# Patient Record
Sex: Female | Born: 1955 | Race: White | Hispanic: No | Marital: Single | Smoking: Former smoker
Health system: Southern US, Community
[De-identification: ages and names within clinical notes are randomized; demographics above are authoritative.]

## PROBLEM LIST (undated history)

## (undated) DIAGNOSIS — I10 Essential (primary) hypertension: Secondary | ICD-10-CM

## (undated) DIAGNOSIS — M1711 Unilateral primary osteoarthritis, right knee: Secondary | ICD-10-CM

## (undated) DIAGNOSIS — M797 Fibromyalgia: Secondary | ICD-10-CM

## (undated) DIAGNOSIS — S90122A Contusion of left lesser toe(s) without damage to nail, initial encounter: Secondary | ICD-10-CM

## (undated) DIAGNOSIS — E039 Hypothyroidism, unspecified: Secondary | ICD-10-CM

## (undated) DIAGNOSIS — M542 Cervicalgia: Secondary | ICD-10-CM

## (undated) DIAGNOSIS — E785 Hyperlipidemia, unspecified: Secondary | ICD-10-CM

## (undated) HISTORY — PX: OTHER SURGICAL HISTORY: SHX169

## (undated) HISTORY — DX: Hyperlipidemia, unspecified: E78.5

## (undated) HISTORY — PX: LAPAROSCOPIC TOTAL HYSTERECTOMY: SUR800

## (undated) HISTORY — PX: ABDOMINAL HYSTERECTOMY: SHX81

## (undated) HISTORY — DX: Fibromyalgia: M79.7

## (undated) HISTORY — DX: Essential (primary) hypertension: I10

## (undated) HISTORY — PX: AUGMENTATION MAMMAPLASTY: SUR837

---

## 2007-05-15 ENCOUNTER — Ambulatory Visit: Payer: Self-pay | Admitting: Dermatology

## 2008-03-20 ENCOUNTER — Ambulatory Visit: Payer: Self-pay | Admitting: Unknown Physician Specialty

## 2008-03-21 ENCOUNTER — Ambulatory Visit: Payer: Self-pay | Admitting: Anesthesiology

## 2008-03-24 HISTORY — PX: CERVICAL DISC SURGERY: SHX588

## 2008-04-04 ENCOUNTER — Ambulatory Visit: Payer: Self-pay | Admitting: Unknown Physician Specialty

## 2008-04-07 ENCOUNTER — Ambulatory Visit: Payer: Self-pay | Admitting: Unknown Physician Specialty

## 2008-05-06 ENCOUNTER — Ambulatory Visit: Payer: Self-pay | Admitting: Unknown Physician Specialty

## 2008-05-07 ENCOUNTER — Ambulatory Visit: Payer: Self-pay | Admitting: Unknown Physician Specialty

## 2008-05-13 ENCOUNTER — Ambulatory Visit: Payer: Self-pay | Admitting: Unknown Physician Specialty

## 2008-12-24 ENCOUNTER — Ambulatory Visit: Payer: Self-pay | Admitting: Unknown Physician Specialty

## 2008-12-30 ENCOUNTER — Ambulatory Visit: Payer: Self-pay | Admitting: Unknown Physician Specialty

## 2009-01-03 ENCOUNTER — Emergency Department: Payer: Self-pay | Admitting: Internal Medicine

## 2010-12-01 ENCOUNTER — Ambulatory Visit: Payer: Self-pay | Admitting: Anesthesiology

## 2011-02-21 ENCOUNTER — Emergency Department: Payer: Self-pay | Admitting: *Deleted

## 2011-02-22 LAB — BASIC METABOLIC PANEL
Anion Gap: 8 (ref 7–16)
Chloride: 107 mmol/L (ref 98–107)
Co2: 30 mmol/L (ref 21–32)
Creatinine: 0.72 mg/dL (ref 0.60–1.30)
Glucose: 88 mg/dL (ref 65–99)
Osmolality: 288 (ref 275–301)

## 2011-09-29 ENCOUNTER — Ambulatory Visit: Payer: Self-pay | Admitting: Unknown Physician Specialty

## 2011-10-31 ENCOUNTER — Ambulatory Visit: Payer: Self-pay | Admitting: Unknown Physician Specialty

## 2012-05-17 ENCOUNTER — Ambulatory Visit: Payer: Self-pay | Admitting: Otolaryngology

## 2013-07-12 IMAGING — MG MM CAD SCREENING MAMMO
1 series · 7 of 7 positions shown · non-contrast
Comparison: none

REASON FOR EXAM: SCR MAMMO NO ORDER CAT 2
COMMENTS:

[R CC · right · 7 of 7 slices shown]
[im 1/7]
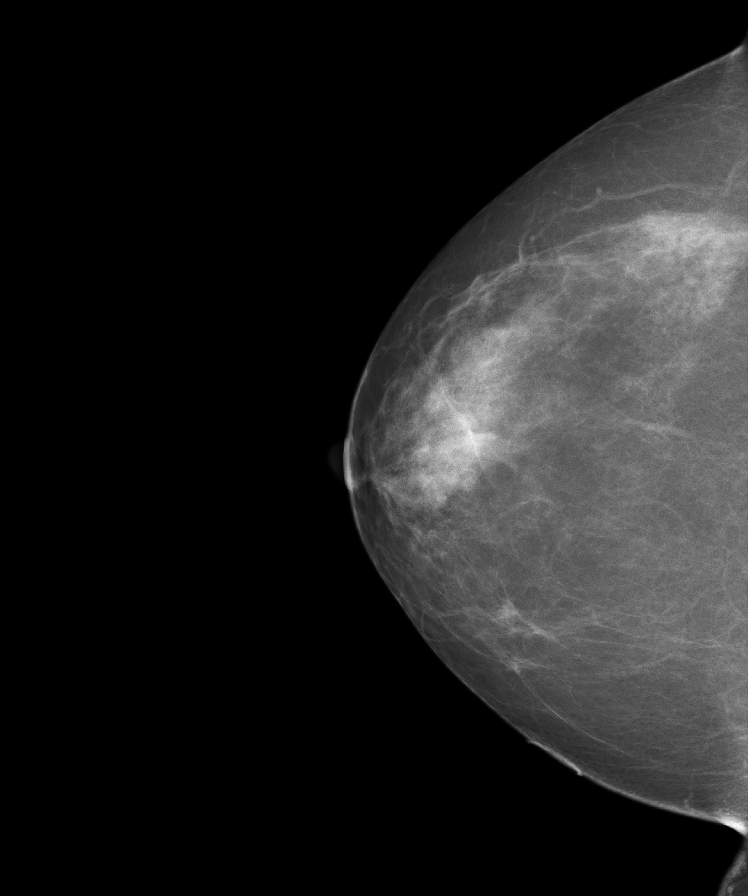
[im 2/7]
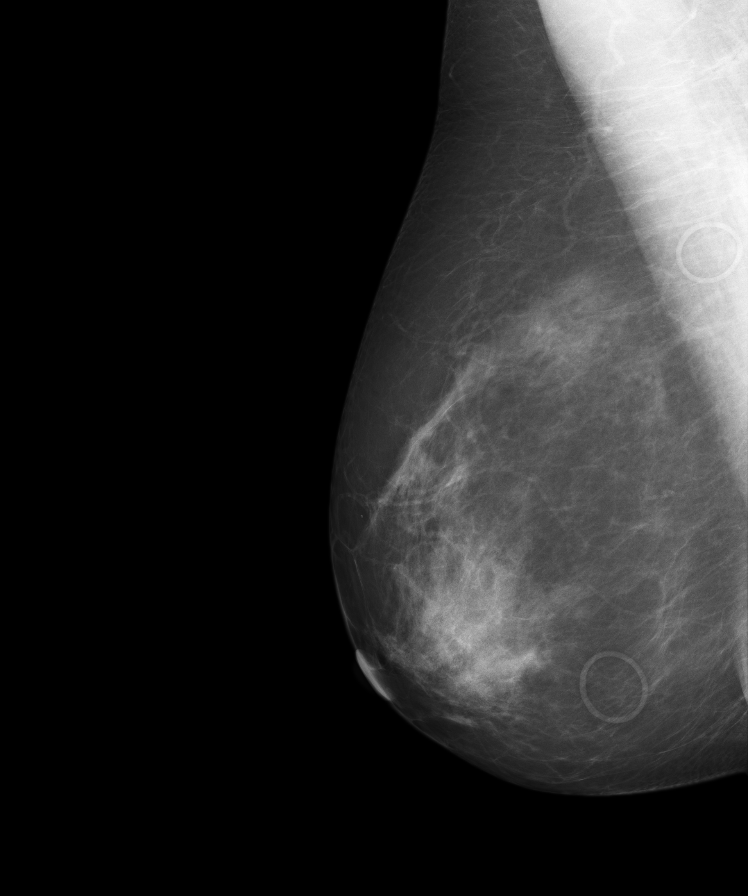
[im 3/7]
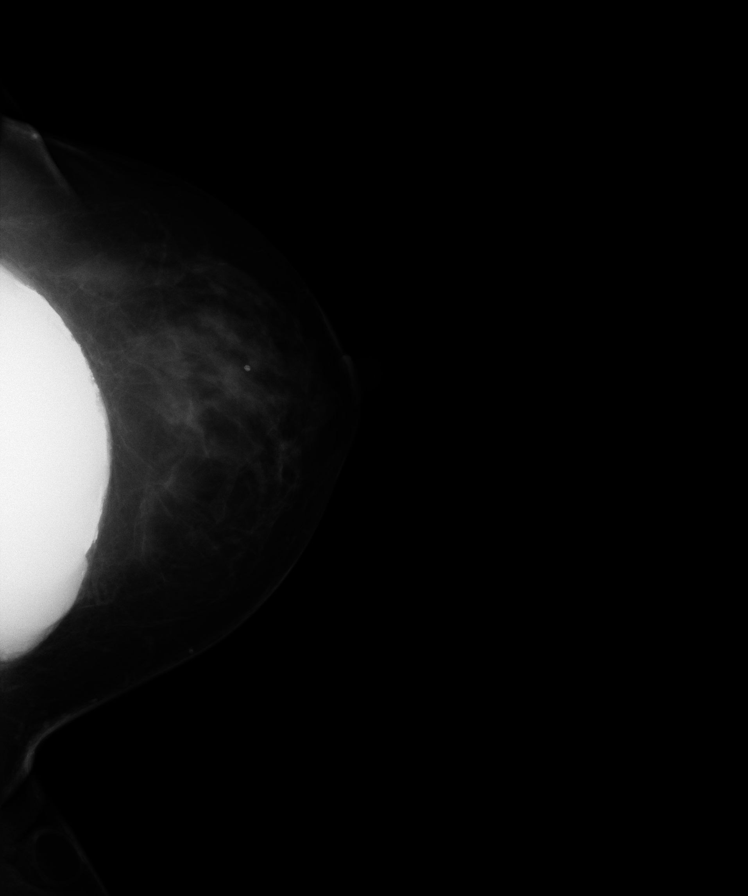
[im 4/7]
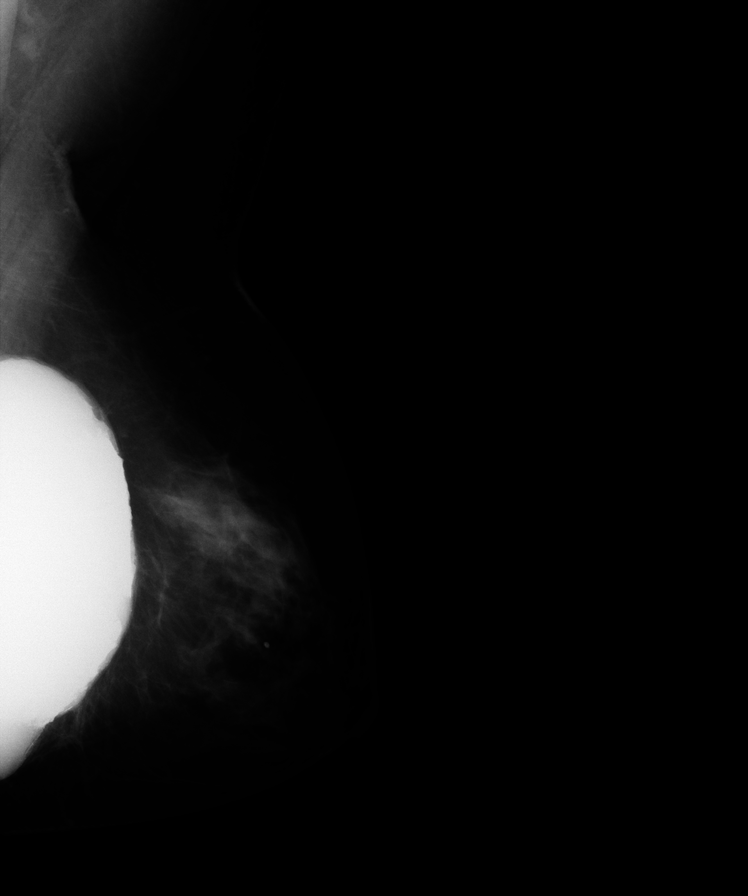
[im 5/7]
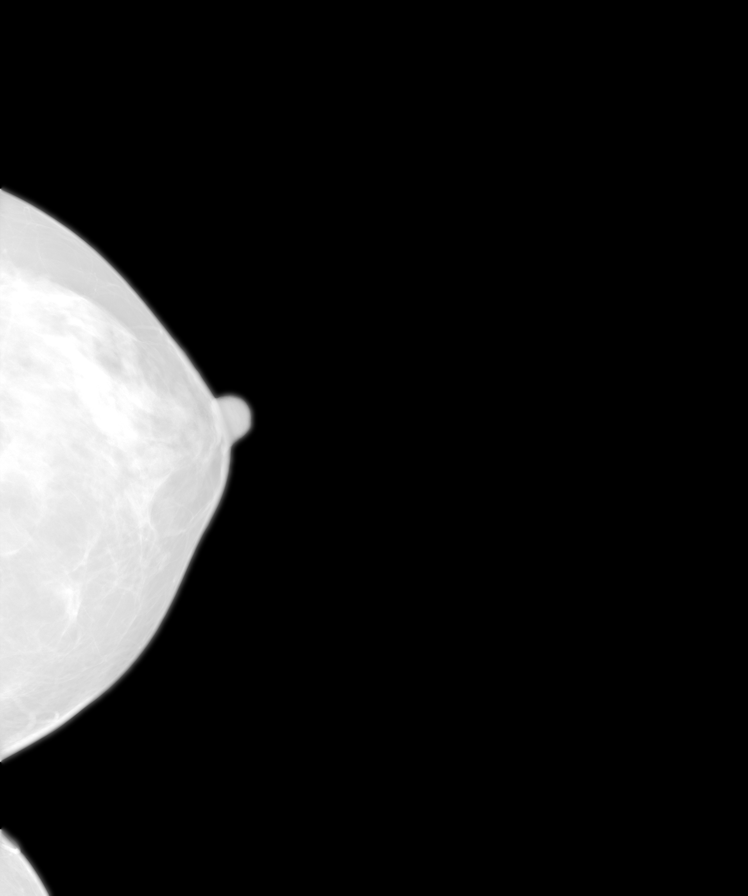
[im 6/7]
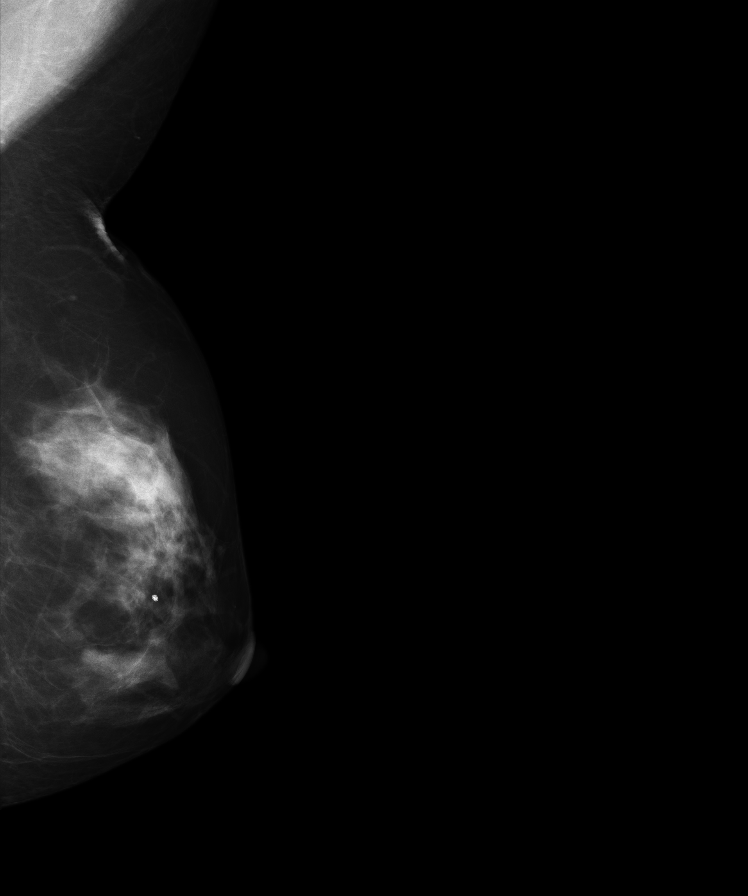
[im 7/7]
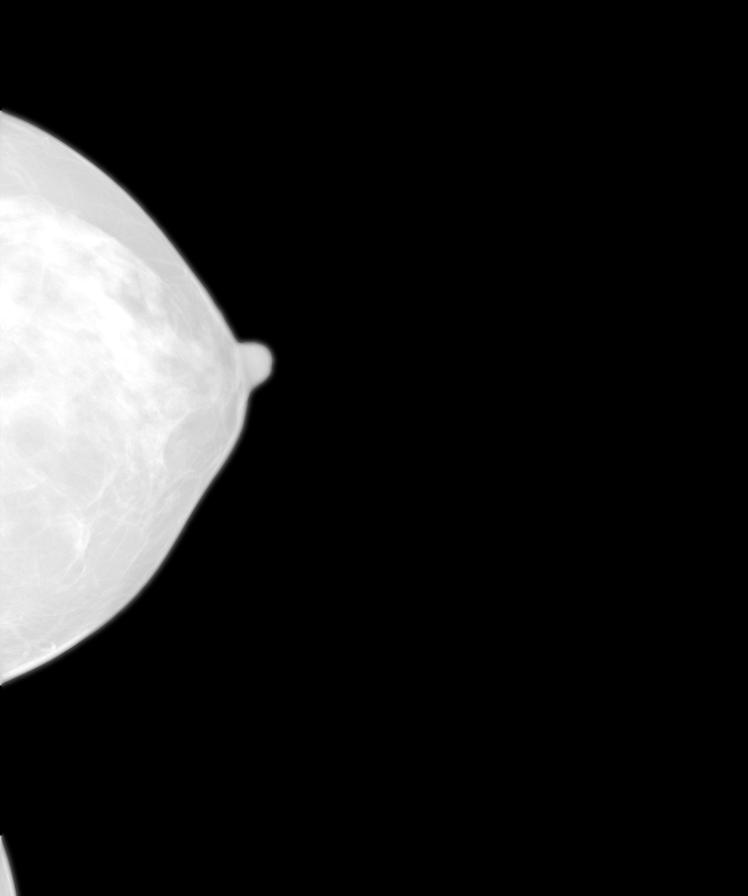

[7 of 7 positions shown; findings below may reference images not displayed]

PROCEDURE:     MAM - MAM DGTL SCRN MAM NO ORDER W/CAD  - September 29, 2011  [DATE]

RESULT:     The patient reports previous left breast implant in 6587. There
is a family history of breast cancer in the patient's maternal aunt.
Comparison is made to previous digital mammograms dated 06 May, 2008, as
well as 28 June, 2001. There is a moderately dense parenchymal pattern without
a dominant mass or malignant appearing calcification. There is no
architectural distortion. There is some lobulation along the inferior aspect
of the implant on the MLO view which is unchanged.
IMPRESSION: 1. Stable, benign appearing bilateral mammogram.

BI-RADS: Category 2 - Benign Finding.

Please continue to encourage annual mammographic follow-up.

A NEGATIVE MAMMOGRAM REPORT DOES NOT PRECLUDE BIOPSY OR OTHER EVALUATION OF
A CLINICALLY PALPABLE OR OTHERWISE SUSPICIOUS MASS OR LESION. BREAST CANCER
MAY NOT BE DETECTED BY MAMMOGRAPHY IN UP TO 10% OF CASES.

[REDACTED]

## 2014-05-06 ENCOUNTER — Encounter: Payer: Self-pay | Admitting: *Deleted

## 2014-11-27 ENCOUNTER — Other Ambulatory Visit: Payer: Self-pay | Admitting: Family

## 2014-11-27 ENCOUNTER — Ambulatory Visit
Admission: RE | Admit: 2014-11-27 | Discharge: 2014-11-27 | Disposition: A | Discharge status: Home / Self Care | Payer: PRIVATE HEALTH INSURANCE | Source: Ambulatory Visit | Attending: Family | Admitting: Family

## 2014-11-27 DIAGNOSIS — Y939 Activity, unspecified: Secondary | ICD-10-CM | POA: Diagnosis not present

## 2014-11-27 DIAGNOSIS — Y92239 Unspecified place in hospital as the place of occurrence of the external cause: Secondary | ICD-10-CM | POA: Insufficient documentation

## 2014-11-27 DIAGNOSIS — R52 Pain, unspecified: Secondary | ICD-10-CM

## 2014-11-27 DIAGNOSIS — M25474 Effusion, right foot: Secondary | ICD-10-CM | POA: Insufficient documentation

## 2014-11-27 DIAGNOSIS — Y990 Civilian activity done for income or pay: Secondary | ICD-10-CM | POA: Diagnosis not present

## 2014-11-27 DIAGNOSIS — W319XXA Contact with unspecified machinery, initial encounter: Secondary | ICD-10-CM | POA: Insufficient documentation

## 2014-11-27 DIAGNOSIS — M79671 Pain in right foot: Secondary | ICD-10-CM | POA: Insufficient documentation

## 2014-12-29 ENCOUNTER — Encounter: Payer: Self-pay | Admitting: Physician Assistant

## 2014-12-29 ENCOUNTER — Ambulatory Visit: Payer: Self-pay | Admitting: Physician Assistant

## 2014-12-29 VITALS — BP 136/80 | Temp 98.8°F

## 2014-12-29 DIAGNOSIS — N390 Urinary tract infection, site not specified: Secondary | ICD-10-CM

## 2014-12-29 DIAGNOSIS — R319 Hematuria, unspecified: Secondary | ICD-10-CM

## 2014-12-29 DIAGNOSIS — R300 Dysuria: Secondary | ICD-10-CM

## 2014-12-29 LAB — POCT URINALYSIS DIPSTICK
Bilirubin, UA: NEGATIVE
KETONES UA: NEGATIVE
Nitrite, UA: POSITIVE
PH UA: 5.5
Spec Grav, UA: 1.025
Urobilinogen, UA: 1

## 2014-12-29 MED ORDER — CIPROFLOXACIN HCL 250 MG PO TABS: 250.0000 mg | ORAL_TABLET | Freq: Two times a day (BID) | ORAL | Status: DC

## 2014-12-29 MED ORDER — FLUCONAZOLE 150 MG PO TABS: 150.0000 mg | ORAL_TABLET | Freq: Once | ORAL | Status: DC

## 2014-12-29 NOTE — Progress Notes (Signed)
S:  C/o uti sx for 2 days, burning, urgency, frequency, denies vaginal discharge, abdominal pain or flank pain:  Remainder ros neg  O:  Vitals wnl, nad, no cva tenderness, back nontender, lungs c t a,cv rrr, abd soft nontender, bs normal, n/v intact  A: uti  P: cipro 250mg bid x 7d, increase water intake, add cranberry juice, return if not improving in 2 -3 days, return earlier if worsening, discussed pyelonephritis sx  

## 2015-02-20 DIAGNOSIS — J069 Acute upper respiratory infection, unspecified: Secondary | ICD-10-CM | POA: Diagnosis not present

## 2015-06-10 ENCOUNTER — Other Ambulatory Visit: Payer: Self-pay | Admitting: Obstetrics and Gynecology

## 2015-06-10 DIAGNOSIS — Z1231 Encounter for screening mammogram for malignant neoplasm of breast: Secondary | ICD-10-CM

## 2015-07-31 ENCOUNTER — Ambulatory Visit: Payer: Self-pay | Admitting: Physician Assistant

## 2015-07-31 ENCOUNTER — Encounter: Payer: Self-pay | Admitting: Physician Assistant

## 2015-07-31 VITALS — BP 152/90 | HR 84 | Temp 98.0°F

## 2015-07-31 DIAGNOSIS — J018 Other acute sinusitis: Secondary | ICD-10-CM

## 2015-07-31 DIAGNOSIS — W57XXXA Bitten or stung by nonvenomous insect and other nonvenomous arthropods, initial encounter: Secondary | ICD-10-CM

## 2015-07-31 MED ORDER — DOXYCYCLINE HYCLATE 100 MG PO TABS: 100.0000 mg | ORAL_TABLET | Freq: Two times a day (BID) | ORAL | Status: DC

## 2015-07-31 MED ORDER — FLUTICASONE PROPIONATE 50 MCG/ACT NA SUSP: 2.0000 | Freq: Every day | NASAL | Status: DC

## 2015-07-31 NOTE — Progress Notes (Signed)
S: C/o headache and  congestion for 3 days, no fever, chills, cp/sob, v/d; mucus is green and thick, cough is sporadic, c/o of facial and dental pain. Also pulled a tick off about a week ago, states has had sinus infections before but never had a headache with it  Using otc meds:   O: PE: perrl eomi, normocephalic, tms dull, nasal mucosa red and swollen, throat injected, neck supple no lymph, lungs c t a, cv rrr, neuro intact  A:  Acute sinusitis, tick bite  P: drink fluids, continue regular meds , use otc meds of choice, return if not improving in 5 days, return earlier if worsening , doxy 100mg  , flonase

## 2015-08-04 ENCOUNTER — Encounter: Payer: Self-pay | Admitting: Physician Assistant

## 2015-08-04 ENCOUNTER — Ambulatory Visit: Payer: Self-pay | Admitting: Physician Assistant

## 2015-08-04 VITALS — BP 142/80 | HR 80 | Temp 99.0°F

## 2015-08-04 DIAGNOSIS — R112 Nausea with vomiting, unspecified: Secondary | ICD-10-CM

## 2015-08-04 DIAGNOSIS — J0110 Acute frontal sinusitis, unspecified: Secondary | ICD-10-CM

## 2015-08-04 MED ORDER — PREDNISONE 10 MG PO TABS: 30.0000 mg | ORAL_TABLET | Freq: Every day | ORAL | Status: DC

## 2015-08-04 MED ORDER — ONDANSETRON 4 MG PO TBDP: 4.0000 mg | ORAL_TABLET | Freq: Three times a day (TID) | ORAL | Status: DC | PRN

## 2015-08-04 NOTE — Progress Notes (Signed)
S: N+ V x 1 day with doxycyline.  Taking for tick bite and sinusitis.  Still has left sided facial pressure.  Sm amount of rash at tick bite but no other rash present.  Con't use of flonase, 5 day of doxycycline. O: Tender left frontal and max sinus,  abd soft non-tender, bs nl.  Minimal rash noted at tick bite site with bite healing w/o infection A: Acute sinusitis and tick bite P: Zofran 4mg  ODT # 20 and prednisone 30mg  x 3 days

## 2015-08-05 DIAGNOSIS — R51 Headache: Secondary | ICD-10-CM | POA: Diagnosis not present

## 2015-08-05 DIAGNOSIS — A938 Other specified arthropod-borne viral fevers: Secondary | ICD-10-CM | POA: Diagnosis not present

## 2015-08-05 DIAGNOSIS — R110 Nausea: Secondary | ICD-10-CM | POA: Diagnosis not present

## 2016-07-22 ENCOUNTER — Encounter: Payer: Self-pay | Admitting: Physician Assistant

## 2016-07-22 ENCOUNTER — Ambulatory Visit: Payer: Self-pay | Admitting: Physician Assistant

## 2016-07-22 VITALS — BP 150/90 | HR 96 | Temp 98.6°F

## 2016-07-22 DIAGNOSIS — J0100 Acute maxillary sinusitis, unspecified: Secondary | ICD-10-CM

## 2016-07-22 MED ORDER — PREDNISONE 10 MG PO TABS: 30.0000 mg | ORAL_TABLET | Freq: Every day | ORAL | 0 refills | Status: DC

## 2016-07-22 MED ORDER — FLUTICASONE PROPIONATE 50 MCG/ACT NA SUSP: 2.0000 | Freq: Every day | NASAL | 6 refills | Status: AC

## 2016-07-22 NOTE — Progress Notes (Addendum)
S: C/o runny nose and congestion for 2 days, no fever, chills, cp/sob, v/d; mucus is ?green and thick, c/o of facial and dental pressure, used mucinex   Using otc meds:   O: PE: vitals wnl, nad, perrl eomi, normocephalic, tms dull, nasal mucosa red and swollen, throat injected, neck supple no lymph, lungs c t a, cv rrr, neuro intact  A:  Acute sinusitis   P: drink fluids, continue regular meds , use otc meds of choice, return if not improving in 5 days, return earlier if worsening , prednisone 30mg  qd x3d, flonase, will call in antibiotic if not better by next week

## 2016-07-25 ENCOUNTER — Telehealth: Payer: Self-pay | Admitting: Family

## 2016-07-25 MED ORDER — AZITHROMYCIN 250 MG PO TABS: ORAL_TABLET | ORAL | 0 refills | Status: DC

## 2016-07-25 NOTE — Telephone Encounter (Signed)
Patient was contacted per Payton Doughty. Moore e scribed Zithromax to [facility] pharmacy.

## 2016-07-26 NOTE — Progress Notes (Signed)
Patient contacted office requesting antibiotic. Per T.Moore prescribed Zpak. Patient was informed escript over to [facility] pharmacy

## 2016-08-12 DIAGNOSIS — M797 Fibromyalgia: Secondary | ICD-10-CM | POA: Diagnosis not present

## 2016-08-12 DIAGNOSIS — E038 Other specified hypothyroidism: Secondary | ICD-10-CM | POA: Diagnosis not present

## 2016-08-19 DIAGNOSIS — E785 Hyperlipidemia, unspecified: Secondary | ICD-10-CM | POA: Diagnosis not present

## 2016-08-19 DIAGNOSIS — E039 Hypothyroidism, unspecified: Secondary | ICD-10-CM | POA: Diagnosis not present

## 2016-10-26 DIAGNOSIS — J329 Chronic sinusitis, unspecified: Secondary | ICD-10-CM | POA: Diagnosis not present

## 2016-12-23 DIAGNOSIS — H04123 Dry eye syndrome of bilateral lacrimal glands: Secondary | ICD-10-CM | POA: Diagnosis not present

## 2017-01-31 DIAGNOSIS — J069 Acute upper respiratory infection, unspecified: Secondary | ICD-10-CM | POA: Diagnosis not present

## 2017-01-31 DIAGNOSIS — J019 Acute sinusitis, unspecified: Secondary | ICD-10-CM | POA: Diagnosis not present

## 2017-05-15 DIAGNOSIS — N390 Urinary tract infection, site not specified: Secondary | ICD-10-CM | POA: Diagnosis not present

## 2017-05-16 DIAGNOSIS — E785 Hyperlipidemia, unspecified: Secondary | ICD-10-CM | POA: Diagnosis not present

## 2017-10-16 DIAGNOSIS — J019 Acute sinusitis, unspecified: Secondary | ICD-10-CM | POA: Diagnosis not present

## 2017-12-26 DIAGNOSIS — J029 Acute pharyngitis, unspecified: Secondary | ICD-10-CM | POA: Diagnosis not present

## 2018-02-07 DIAGNOSIS — R05 Cough: Secondary | ICD-10-CM | POA: Diagnosis not present

## 2018-02-07 DIAGNOSIS — J069 Acute upper respiratory infection, unspecified: Secondary | ICD-10-CM | POA: Diagnosis not present

## 2018-02-19 DIAGNOSIS — J069 Acute upper respiratory infection, unspecified: Secondary | ICD-10-CM | POA: Diagnosis not present

## 2018-02-19 DIAGNOSIS — R05 Cough: Secondary | ICD-10-CM | POA: Diagnosis not present

## 2018-03-07 DIAGNOSIS — J111 Influenza due to unidentified influenza virus with other respiratory manifestations: Secondary | ICD-10-CM | POA: Diagnosis not present

## 2018-04-02 DIAGNOSIS — R531 Weakness: Secondary | ICD-10-CM | POA: Diagnosis not present

## 2018-04-02 DIAGNOSIS — M797 Fibromyalgia: Secondary | ICD-10-CM | POA: Diagnosis not present

## 2018-04-02 DIAGNOSIS — M2550 Pain in unspecified joint: Secondary | ICD-10-CM | POA: Diagnosis not present

## 2018-04-04 DIAGNOSIS — R5383 Other fatigue: Secondary | ICD-10-CM | POA: Diagnosis not present

## 2018-04-04 DIAGNOSIS — M797 Fibromyalgia: Secondary | ICD-10-CM | POA: Diagnosis not present

## 2018-05-21 DIAGNOSIS — M797 Fibromyalgia: Secondary | ICD-10-CM | POA: Diagnosis not present

## 2018-07-10 DIAGNOSIS — M797 Fibromyalgia: Secondary | ICD-10-CM | POA: Diagnosis not present

## 2018-08-21 DIAGNOSIS — M797 Fibromyalgia: Secondary | ICD-10-CM | POA: Diagnosis not present

## 2018-08-21 DIAGNOSIS — F330 Major depressive disorder, recurrent, mild: Secondary | ICD-10-CM | POA: Diagnosis not present

## 2018-10-10 DIAGNOSIS — M797 Fibromyalgia: Secondary | ICD-10-CM | POA: Diagnosis not present

## 2018-10-10 DIAGNOSIS — Z23 Encounter for immunization: Secondary | ICD-10-CM | POA: Diagnosis not present

## 2018-12-05 DIAGNOSIS — M797 Fibromyalgia: Secondary | ICD-10-CM | POA: Diagnosis not present

## 2019-02-05 DIAGNOSIS — F330 Major depressive disorder, recurrent, mild: Secondary | ICD-10-CM | POA: Diagnosis not present

## 2019-02-05 DIAGNOSIS — M797 Fibromyalgia: Secondary | ICD-10-CM | POA: Diagnosis not present

## 2019-03-19 DIAGNOSIS — R05 Cough: Secondary | ICD-10-CM | POA: Diagnosis not present

## 2019-03-19 DIAGNOSIS — M797 Fibromyalgia: Secondary | ICD-10-CM | POA: Diagnosis not present

## 2019-05-01 ENCOUNTER — Other Ambulatory Visit: Payer: Self-pay | Admitting: Internal Medicine

## 2019-05-29 DIAGNOSIS — F330 Major depressive disorder, recurrent, mild: Secondary | ICD-10-CM | POA: Diagnosis not present

## 2019-05-29 DIAGNOSIS — E038 Other specified hypothyroidism: Secondary | ICD-10-CM | POA: Diagnosis not present

## 2019-05-29 DIAGNOSIS — M797 Fibromyalgia: Secondary | ICD-10-CM | POA: Diagnosis not present

## 2019-08-01 ENCOUNTER — Other Ambulatory Visit: Payer: Self-pay | Admitting: Internal Medicine

## 2019-08-01 DIAGNOSIS — Z0000 Encounter for general adult medical examination without abnormal findings: Secondary | ICD-10-CM | POA: Diagnosis not present

## 2019-08-01 DIAGNOSIS — Z1231 Encounter for screening mammogram for malignant neoplasm of breast: Secondary | ICD-10-CM

## 2019-08-07 ENCOUNTER — Other Ambulatory Visit: Payer: Self-pay | Admitting: Internal Medicine

## 2019-09-06 ENCOUNTER — Other Ambulatory Visit: Payer: Self-pay | Admitting: Internal Medicine

## 2019-09-06 DIAGNOSIS — E038 Other specified hypothyroidism: Secondary | ICD-10-CM | POA: Diagnosis not present

## 2019-09-06 DIAGNOSIS — M797 Fibromyalgia: Secondary | ICD-10-CM | POA: Diagnosis not present

## 2019-09-06 DIAGNOSIS — J019 Acute sinusitis, unspecified: Secondary | ICD-10-CM | POA: Diagnosis not present

## 2019-10-07 ENCOUNTER — Other Ambulatory Visit: Payer: Self-pay

## 2019-10-07 ENCOUNTER — Ambulatory Visit
Admission: RE | Admit: 2019-10-07 | Discharge: 2019-10-07 | Disposition: A | Discharge status: Home / Self Care | Payer: 59 | Source: Ambulatory Visit | Attending: Internal Medicine | Admitting: Internal Medicine

## 2019-10-07 ENCOUNTER — Other Ambulatory Visit: Payer: Self-pay | Admitting: Internal Medicine

## 2019-10-07 DIAGNOSIS — Z1231 Encounter for screening mammogram for malignant neoplasm of breast: Secondary | ICD-10-CM

## 2019-10-23 DIAGNOSIS — N390 Urinary tract infection, site not specified: Secondary | ICD-10-CM | POA: Diagnosis not present

## 2019-11-21 ENCOUNTER — Other Ambulatory Visit: Payer: Self-pay | Admitting: Internal Medicine

## 2020-01-14 ENCOUNTER — Other Ambulatory Visit: Payer: Self-pay | Admitting: Internal Medicine

## 2020-03-02 ENCOUNTER — Other Ambulatory Visit: Payer: Self-pay | Admitting: Internal Medicine

## 2020-03-03 ENCOUNTER — Other Ambulatory Visit: Payer: Self-pay | Admitting: Internal Medicine

## 2020-04-03 ENCOUNTER — Other Ambulatory Visit: Payer: Self-pay | Admitting: Internal Medicine

## 2020-05-04 MED FILL — Tramadol HCl Tab 50 MG: ORAL | 25 days supply | Qty: 100 | Fill #0 | Status: AC

## 2020-05-04 MED FILL — Duloxetine HCl Enteric Coated Pellets Cap 30 MG (Base Eq): ORAL | 60 days supply | Qty: 180 | Fill #0 | Status: AC

## 2020-05-04 MED FILL — Metoprolol Succinate Tab ER 24HR 25 MG (Tartrate Equiv): ORAL | 30 days supply | Qty: 30 | Fill #0 | Status: AC

## 2020-05-05 ENCOUNTER — Other Ambulatory Visit: Payer: Self-pay

## 2020-06-02 ENCOUNTER — Other Ambulatory Visit: Payer: Self-pay

## 2020-06-02 MED ORDER — METOPROLOL SUCCINATE ER 50 MG PO TB24
ORAL_TABLET | ORAL | 3 refills | Status: DC
  Filled 2020-06-02: qty 30, 30d supply, fill #0
  Filled 2020-06-22 – 2020-06-26 (×2): qty 30, 30d supply, fill #1
  Filled 2020-08-02: qty 30, 30d supply, fill #2
  Filled 2020-09-03: qty 30, 30d supply, fill #3
  Filled 2020-10-06: qty 30, 30d supply, fill #4
  Filled 2020-11-08: qty 30, 30d supply, fill #5
  Filled 2020-12-08: qty 30, 30d supply, fill #6
  Filled 2021-01-07: qty 30, 30d supply, fill #7
  Filled 2021-02-08: qty 30, 30d supply, fill #8
  Filled 2021-03-12: qty 30, 30d supply, fill #9
  Filled 2021-05-10: qty 30, 30d supply, fill #10

## 2020-06-02 MED FILL — Zolpidem Tartrate Tab 10 MG: ORAL | 30 days supply | Qty: 30 | Fill #0 | Status: AC

## 2020-06-02 MED FILL — Rosuvastatin Calcium Tab 10 MG: ORAL | 30 days supply | Qty: 30 | Fill #0 | Status: AC

## 2020-06-22 ENCOUNTER — Other Ambulatory Visit: Payer: Self-pay

## 2020-06-22 MED FILL — Tramadol HCl Tab 50 MG: ORAL | 25 days supply | Qty: 100 | Fill #1 | Status: AC

## 2020-06-22 MED FILL — Zolpidem Tartrate Tab 10 MG: ORAL | 30 days supply | Qty: 30 | Fill #1 | Status: CN

## 2020-06-23 ENCOUNTER — Other Ambulatory Visit: Payer: Self-pay

## 2020-06-23 MED ORDER — DULOXETINE HCL 30 MG PO CPEP
90.0000 mg | ORAL_CAPSULE | Freq: Every day | ORAL | 3 refills | Status: DC
  Filled 2020-06-23: qty 90, 30d supply, fill #0
  Filled 2020-08-02: qty 90, 30d supply, fill #1
  Filled 2020-09-03: qty 90, 30d supply, fill #2
  Filled 2020-10-06: qty 90, 30d supply, fill #3
  Filled 2020-11-08: qty 90, 30d supply, fill #4
  Filled 2020-12-08: qty 90, 30d supply, fill #5
  Filled 2021-01-07: qty 90, 30d supply, fill #6
  Filled 2021-02-08: qty 90, 30d supply, fill #7
  Filled 2021-03-12: qty 90, 30d supply, fill #8
  Filled 2021-04-08: qty 90, 30d supply, fill #9
  Filled 2021-05-10: qty 90, 30d supply, fill #10
  Filled 2021-06-02: qty 90, 30d supply, fill #11

## 2020-06-23 MED ORDER — AMITRIPTYLINE HCL 10 MG PO TABS
10.0000 mg | ORAL_TABLET | Freq: Every day | ORAL | 3 refills | Status: DC
  Filled 2020-06-23: qty 60, 30d supply, fill #0
  Filled 2020-08-02: qty 60, 30d supply, fill #1
  Filled 2020-09-02: qty 60, 30d supply, fill #2
  Filled 2020-10-06: qty 60, 30d supply, fill #3
  Filled 2020-11-08: qty 60, 30d supply, fill #4
  Filled 2020-12-08: qty 60, 30d supply, fill #5
  Filled 2021-01-07: qty 60, 30d supply, fill #6
  Filled 2021-02-08: qty 60, 30d supply, fill #7
  Filled 2021-03-12: qty 60, 30d supply, fill #8
  Filled 2021-05-10: qty 60, 30d supply, fill #9
  Filled 2021-06-15: qty 60, 30d supply, fill #10

## 2020-06-24 ENCOUNTER — Other Ambulatory Visit: Payer: Self-pay

## 2020-06-24 MED FILL — Zolpidem Tartrate Tab 10 MG: ORAL | 30 days supply | Qty: 30 | Fill #1 | Status: CN

## 2020-06-26 ENCOUNTER — Other Ambulatory Visit: Payer: Self-pay

## 2020-06-26 MED FILL — Zolpidem Tartrate Tab 10 MG: ORAL | 30 days supply | Qty: 30 | Fill #1 | Status: AC

## 2020-08-02 ENCOUNTER — Other Ambulatory Visit: Payer: Self-pay

## 2020-08-02 MED FILL — Rosuvastatin Calcium Tab 10 MG: ORAL | 30 days supply | Qty: 30 | Fill #1 | Status: AC

## 2020-08-02 MED FILL — Amlodipine Besylate Tab 5 MG (Base Equivalent): ORAL | 30 days supply | Qty: 30 | Fill #0 | Status: AC

## 2020-08-03 ENCOUNTER — Other Ambulatory Visit: Payer: Self-pay

## 2020-08-04 ENCOUNTER — Other Ambulatory Visit: Payer: Self-pay

## 2020-08-04 MED ORDER — TRAMADOL HCL 50 MG PO TABS
ORAL_TABLET | ORAL | 4 refills | Status: DC
  Filled 2020-08-04: qty 100, 25d supply, fill #0
  Filled 2020-10-06: qty 28, 7d supply, fill #1
  Filled 2020-11-08: qty 28, 7d supply, fill #2
  Filled 2020-12-08: qty 100, 25d supply, fill #3
  Filled 2021-01-07: qty 100, 25d supply, fill #4

## 2020-08-06 ENCOUNTER — Other Ambulatory Visit: Payer: Self-pay

## 2020-08-07 ENCOUNTER — Other Ambulatory Visit: Payer: Self-pay

## 2020-08-07 MED ORDER — ZOLPIDEM TARTRATE 10 MG PO TABS
ORAL_TABLET | ORAL | 1 refills | Status: DC
  Filled 2020-08-07: qty 30, 30d supply, fill #0
  Filled 2020-09-03: qty 30, 30d supply, fill #1
  Filled 2020-10-06: qty 30, 30d supply, fill #2
  Filled 2020-11-08: qty 30, 30d supply, fill #3
  Filled 2020-12-08: qty 30, 30d supply, fill #4
  Filled 2021-01-07: qty 30, 30d supply, fill #5

## 2020-08-10 ENCOUNTER — Other Ambulatory Visit: Payer: Self-pay

## 2020-08-10 MED ORDER — LEVOTHYROXINE SODIUM 75 MCG PO TABS
ORAL_TABLET | Freq: Every day | ORAL | 3 refills | Status: DC
  Filled 2020-08-10 – 2020-09-02 (×2): qty 30, 30d supply, fill #0
  Filled 2020-11-08: qty 30, 30d supply, fill #1
  Filled 2020-12-08: qty 30, 30d supply, fill #2
  Filled 2021-01-07: qty 30, 30d supply, fill #3
  Filled 2021-02-08: qty 30, 30d supply, fill #4
  Filled 2021-03-12: qty 30, 30d supply, fill #5
  Filled 2021-04-08: qty 30, 30d supply, fill #6
  Filled 2021-05-10: qty 30, 30d supply, fill #7
  Filled 2021-06-15: qty 30, 30d supply, fill #8
  Filled 2021-08-08: qty 30, 30d supply, fill #9

## 2020-08-31 ENCOUNTER — Other Ambulatory Visit: Payer: Self-pay

## 2020-09-02 ENCOUNTER — Other Ambulatory Visit: Payer: Self-pay

## 2020-09-03 ENCOUNTER — Other Ambulatory Visit: Payer: Self-pay

## 2020-09-03 MED ORDER — ROSUVASTATIN CALCIUM 10 MG PO TABS
ORAL_TABLET | ORAL | 3 refills | Status: DC
  Filled 2020-09-03: qty 30, 30d supply, fill #0
  Filled 2020-09-03: qty 90, 90d supply, fill #0
  Filled 2020-10-06: qty 30, 30d supply, fill #1
  Filled 2020-11-08: qty 30, 30d supply, fill #2
  Filled 2020-12-08: qty 30, 30d supply, fill #3
  Filled 2021-01-07: qty 30, 30d supply, fill #4
  Filled 2021-02-08: qty 30, 30d supply, fill #5
  Filled 2021-03-12: qty 30, 30d supply, fill #6
  Filled 2021-05-10: qty 30, 30d supply, fill #7
  Filled 2021-06-15: qty 30, 30d supply, fill #8
  Filled 2021-08-08: qty 30, 30d supply, fill #9

## 2020-09-03 MED FILL — Esterified Estrogens & Methyltestosterone Tab 0.625-1.25 MG: ORAL | 90 days supply | Qty: 90 | Fill #0 | Status: CN

## 2020-09-03 MED FILL — Esterified Estrogens & Methyltestosterone Tab 0.625-1.25 MG: ORAL | 30 days supply | Qty: 30 | Fill #0 | Status: CN

## 2020-10-06 MED FILL — Amlodipine Besylate Tab 5 MG (Base Equivalent): ORAL | 30 days supply | Qty: 30 | Fill #1 | Status: AC

## 2020-10-07 ENCOUNTER — Other Ambulatory Visit: Payer: Self-pay

## 2020-10-09 ENCOUNTER — Other Ambulatory Visit: Payer: Self-pay

## 2020-11-09 ENCOUNTER — Other Ambulatory Visit: Payer: Self-pay

## 2020-12-08 ENCOUNTER — Other Ambulatory Visit: Payer: Self-pay

## 2020-12-08 MED FILL — Amlodipine Besylate Tab 5 MG (Base Equivalent): ORAL | 30 days supply | Qty: 30 | Fill #2 | Status: AC

## 2021-01-07 ENCOUNTER — Other Ambulatory Visit: Payer: Self-pay

## 2021-01-11 ENCOUNTER — Other Ambulatory Visit: Payer: Self-pay

## 2021-01-11 MED ORDER — AMLODIPINE BESYLATE 5 MG PO TABS
ORAL_TABLET | ORAL | 3 refills | Status: DC
  Filled 2021-01-11 – 2021-02-08 (×2): qty 90, 90d supply, fill #0

## 2021-01-25 ENCOUNTER — Other Ambulatory Visit: Payer: Self-pay

## 2021-01-26 ENCOUNTER — Other Ambulatory Visit: Payer: Self-pay

## 2021-01-26 MED ORDER — HYDROCOD POLST-CPM POLST ER 10-8 MG/5ML PO SUER
ORAL | 0 refills | Status: DC
  Filled 2021-01-26: qty 90, 9d supply, fill #0

## 2021-01-27 ENCOUNTER — Ambulatory Visit
Admission: RE | Admit: 2021-01-27 | Discharge: 2021-01-27 | Disposition: A | Discharge status: Home / Self Care | Payer: Medicare HMO | Source: Ambulatory Visit | Attending: Internal Medicine | Admitting: Internal Medicine

## 2021-01-27 ENCOUNTER — Other Ambulatory Visit: Payer: Self-pay

## 2021-01-27 ENCOUNTER — Other Ambulatory Visit: Payer: Self-pay | Admitting: Internal Medicine

## 2021-01-27 DIAGNOSIS — R059 Cough, unspecified: Secondary | ICD-10-CM | POA: Insufficient documentation

## 2021-02-08 ENCOUNTER — Other Ambulatory Visit: Payer: Self-pay

## 2021-02-08 MED FILL — Clonidine HCl Tab 0.1 MG: ORAL | 30 days supply | Qty: 30 | Fill #0 | Status: AC

## 2021-02-09 ENCOUNTER — Other Ambulatory Visit: Payer: Self-pay

## 2021-02-09 MED ORDER — TRAMADOL HCL 50 MG PO TABS
ORAL_TABLET | ORAL | 4 refills | Status: DC
  Filled 2021-02-09: qty 100, 25d supply, fill #0
  Filled 2021-03-12: qty 100, 25d supply, fill #1
  Filled 2021-04-08: qty 100, 25d supply, fill #2
  Filled 2021-05-10: qty 100, 25d supply, fill #3
  Filled 2021-06-15: qty 100, 25d supply, fill #4

## 2021-02-09 MED ORDER — ZOLPIDEM TARTRATE 10 MG PO TABS
ORAL_TABLET | ORAL | 2 refills | Status: DC
  Filled 2021-02-09: qty 90, 90d supply, fill #0
  Filled 2021-05-10: qty 90, 90d supply, fill #1
  Filled 2021-08-08: qty 90, 90d supply, fill #2

## 2021-02-10 ENCOUNTER — Other Ambulatory Visit: Payer: Self-pay

## 2021-02-10 MED ORDER — CEFDINIR 300 MG PO CAPS
ORAL_CAPSULE | ORAL | 0 refills | Status: DC
  Filled 2021-02-10: qty 20, 10d supply, fill #0

## 2021-02-10 MED ORDER — CEFUROXIME AXETIL 250 MG PO TABS
ORAL_TABLET | ORAL | 0 refills | Status: DC
  Filled 2021-02-10: qty 10, 5d supply, fill #0

## 2021-03-12 ENCOUNTER — Other Ambulatory Visit: Payer: Self-pay

## 2021-04-09 ENCOUNTER — Other Ambulatory Visit: Payer: Self-pay

## 2021-05-10 ENCOUNTER — Other Ambulatory Visit: Payer: Self-pay

## 2021-06-02 ENCOUNTER — Other Ambulatory Visit: Payer: Self-pay

## 2021-06-03 ENCOUNTER — Other Ambulatory Visit: Payer: Self-pay

## 2021-06-04 ENCOUNTER — Other Ambulatory Visit: Payer: Self-pay

## 2021-06-07 ENCOUNTER — Other Ambulatory Visit: Payer: Self-pay

## 2021-06-07 MED ORDER — METOPROLOL SUCCINATE ER 50 MG PO TB24
ORAL_TABLET | ORAL | 3 refills | Status: DC
Start: 2021-06-07 — End: 2022-07-15
  Filled 2021-06-07: qty 90, 90d supply, fill #0
  Filled 2021-09-21: qty 90, 90d supply, fill #1
  Filled 2022-01-12: qty 90, 90d supply, fill #2
  Filled 2022-04-17: qty 90, 90d supply, fill #3

## 2021-06-15 ENCOUNTER — Other Ambulatory Visit: Payer: Self-pay

## 2021-06-16 ENCOUNTER — Other Ambulatory Visit: Payer: Self-pay

## 2021-06-16 MED ORDER — DULOXETINE HCL 30 MG PO CPEP
90.0000 mg | ORAL_CAPSULE | Freq: Every day | ORAL | 3 refills | Status: DC
  Filled 2021-07-01: qty 270, 90d supply, fill #0
  Filled 2021-09-21: qty 270, 90d supply, fill #1
  Filled 2022-01-12: qty 270, 90d supply, fill #2

## 2021-06-23 ENCOUNTER — Other Ambulatory Visit: Payer: Self-pay

## 2021-07-01 ENCOUNTER — Other Ambulatory Visit: Payer: Self-pay

## 2021-07-02 ENCOUNTER — Other Ambulatory Visit: Payer: Self-pay

## 2021-07-02 ENCOUNTER — Encounter: Payer: Self-pay | Admitting: Pharmacist

## 2021-07-02 MED ORDER — AMITRIPTYLINE HCL 10 MG PO TABS
10.0000 mg | ORAL_TABLET | Freq: Every day | ORAL | 3 refills | Status: DC
  Filled 2021-07-02: qty 180, 90d supply, fill #0
  Filled 2021-08-08: qty 60, 30d supply, fill #0
  Filled 2021-09-02: qty 60, 30d supply, fill #1
  Filled 2021-09-21: qty 59, 29d supply, fill #1
  Filled 2021-09-22: qty 1, 1d supply, fill #1
  Filled 2021-10-17: qty 60, 30d supply, fill #2
  Filled 2021-11-07 – 2022-01-12 (×2): qty 60, 30d supply, fill #3
  Filled 2022-02-27: qty 60, 30d supply, fill #4
  Filled 2022-04-17: qty 60, 30d supply, fill #5
  Filled 2022-05-20: qty 60, 30d supply, fill #6

## 2021-08-08 ENCOUNTER — Other Ambulatory Visit: Payer: Self-pay

## 2021-08-09 ENCOUNTER — Other Ambulatory Visit: Payer: Self-pay

## 2021-08-10 ENCOUNTER — Other Ambulatory Visit: Payer: Self-pay

## 2021-08-11 ENCOUNTER — Other Ambulatory Visit: Payer: Self-pay

## 2021-08-11 MED ORDER — ZOLPIDEM TARTRATE 10 MG PO TABS
ORAL_TABLET | ORAL | 3 refills | Status: DC
  Filled 2021-08-11: qty 90, 90d supply, fill #0
  Filled 2021-11-07: qty 90, 90d supply, fill #1
  Filled 2022-01-12 – 2022-02-02 (×3): qty 90, 90d supply, fill #2

## 2021-08-11 MED ORDER — TRAMADOL HCL 50 MG PO TABS
ORAL_TABLET | ORAL | 3 refills | Status: DC
  Filled 2021-08-11: qty 100, 25d supply, fill #0
  Filled 2021-09-21: qty 100, 25d supply, fill #1
  Filled 2022-01-12: qty 100, 25d supply, fill #2

## 2021-09-02 ENCOUNTER — Other Ambulatory Visit: Payer: Self-pay

## 2021-09-03 ENCOUNTER — Other Ambulatory Visit: Payer: Self-pay

## 2021-09-03 MED ORDER — LEVOTHYROXINE SODIUM 75 MCG PO TABS
ORAL_TABLET | Freq: Every day | ORAL | 3 refills | Status: DC
  Filled 2021-09-03 – 2021-09-21 (×2): qty 90, 90d supply, fill #0
  Filled 2022-01-12: qty 90, 90d supply, fill #1
  Filled 2022-04-17: qty 90, 90d supply, fill #2
  Filled 2022-07-12: qty 90, 90d supply, fill #3

## 2021-09-16 ENCOUNTER — Other Ambulatory Visit: Payer: Self-pay

## 2021-09-21 ENCOUNTER — Other Ambulatory Visit: Payer: Self-pay

## 2021-09-22 ENCOUNTER — Other Ambulatory Visit: Payer: Self-pay

## 2021-09-22 MED ORDER — ROSUVASTATIN CALCIUM 10 MG PO TABS
ORAL_TABLET | ORAL | 3 refills | Status: DC
  Filled 2021-09-22: qty 90, 90d supply, fill #0
  Filled 2022-01-12: qty 90, 90d supply, fill #1
  Filled 2022-04-17: qty 90, 90d supply, fill #2
  Filled 2022-07-12: qty 90, 90d supply, fill #3

## 2021-10-18 ENCOUNTER — Other Ambulatory Visit: Payer: Self-pay

## 2021-10-19 ENCOUNTER — Other Ambulatory Visit: Payer: Self-pay

## 2021-10-19 MED ORDER — LOSARTAN POTASSIUM 50 MG PO TABS
50.0000 mg | ORAL_TABLET | Freq: Every day | ORAL | 3 refills | Status: DC
  Filled 2021-10-19: qty 90, 90d supply, fill #0
  Filled 2022-01-12: qty 90, 90d supply, fill #1
  Filled 2022-04-17: qty 90, 90d supply, fill #2
  Filled 2022-07-12: qty 90, 90d supply, fill #3

## 2021-11-07 ENCOUNTER — Other Ambulatory Visit: Payer: Self-pay

## 2021-11-08 ENCOUNTER — Other Ambulatory Visit: Payer: Self-pay

## 2021-11-09 ENCOUNTER — Other Ambulatory Visit: Payer: Self-pay

## 2021-11-23 ENCOUNTER — Other Ambulatory Visit: Payer: Self-pay

## 2022-01-13 ENCOUNTER — Other Ambulatory Visit: Payer: Self-pay

## 2022-02-02 ENCOUNTER — Other Ambulatory Visit: Payer: Self-pay

## 2022-02-14 ENCOUNTER — Other Ambulatory Visit: Payer: Self-pay

## 2022-02-14 MED ORDER — NAPROXEN 250 MG PO TABS
ORAL_TABLET | ORAL | 0 refills | Status: DC
Start: 2022-02-14 — End: 2023-08-03
  Filled 2022-02-14: qty 16, 5d supply, fill #0

## 2022-02-14 MED ORDER — AZITHROMYCIN 250 MG PO TABS
ORAL_TABLET | ORAL | 0 refills | Status: DC
Start: 2022-02-14 — End: 2023-08-03
  Filled 2022-02-14: qty 6, 5d supply, fill #0

## 2022-02-14 MED ORDER — CHLORHEXIDINE GLUCONATE 0.12 % MT SOLN
15.0000 mL | Freq: Two times a day (BID) | OROMUCOSAL | 12 refills | Status: DC
  Filled 2022-02-14: qty 473, 16d supply, fill #0
  Filled 2022-03-01: qty 473, 16d supply, fill #1

## 2022-02-15 ENCOUNTER — Other Ambulatory Visit: Payer: Self-pay

## 2022-02-27 ENCOUNTER — Other Ambulatory Visit: Payer: Self-pay

## 2022-02-28 ENCOUNTER — Other Ambulatory Visit: Payer: Self-pay

## 2022-02-28 MED ORDER — TRAMADOL HCL 50 MG PO TABS
50.0000 mg | ORAL_TABLET | Freq: Four times a day (QID) | ORAL | 3 refills | Status: DC
  Filled 2022-02-28: qty 100, 25d supply, fill #0
  Filled 2022-04-17: qty 100, 25d supply, fill #1
  Filled 2022-05-20: qty 100, 25d supply, fill #2
  Filled 2022-07-12: qty 100, 25d supply, fill #3

## 2022-03-01 ENCOUNTER — Other Ambulatory Visit: Payer: Self-pay

## 2022-03-01 MED ORDER — VENLAFAXINE HCL ER 75 MG PO CP24
75.0000 mg | ORAL_CAPSULE | Freq: Every day | ORAL | 3 refills | Status: DC
  Filled 2022-03-01: qty 90, 90d supply, fill #0
  Filled 2022-05-20: qty 90, 90d supply, fill #1

## 2022-03-03 ENCOUNTER — Other Ambulatory Visit: Payer: Self-pay

## 2022-04-17 ENCOUNTER — Other Ambulatory Visit: Payer: Self-pay

## 2022-04-18 ENCOUNTER — Other Ambulatory Visit: Payer: Self-pay

## 2022-04-18 MED ORDER — ZOLPIDEM TARTRATE 10 MG PO TABS
10.0000 mg | ORAL_TABLET | Freq: Every evening | ORAL | 1 refills | Status: DC | PRN
  Filled 2022-04-22 – 2022-05-02 (×2): qty 90, 90d supply, fill #0
  Filled 2022-07-12 – 2022-08-02 (×2): qty 90, 90d supply, fill #1

## 2022-04-22 ENCOUNTER — Other Ambulatory Visit: Payer: Self-pay

## 2022-04-25 ENCOUNTER — Other Ambulatory Visit: Payer: Self-pay

## 2022-04-29 ENCOUNTER — Other Ambulatory Visit: Payer: Self-pay

## 2022-05-02 ENCOUNTER — Other Ambulatory Visit: Payer: Self-pay

## 2022-05-20 ENCOUNTER — Other Ambulatory Visit: Payer: Self-pay

## 2022-05-23 ENCOUNTER — Other Ambulatory Visit: Payer: Self-pay

## 2022-05-23 MED ORDER — VENLAFAXINE HCL ER 150 MG PO CP24
150.0000 mg | ORAL_CAPSULE | Freq: Every day | ORAL | 4 refills | Status: DC
  Filled 2022-05-23: qty 90, 90d supply, fill #0
  Filled 2022-08-23: qty 90, 90d supply, fill #1
  Filled 2022-10-26 – 2022-11-22 (×2): qty 90, 90d supply, fill #2
  Filled 2023-02-16: qty 90, 90d supply, fill #3
  Filled 2023-05-01: qty 90, 90d supply, fill #4

## 2022-05-30 ENCOUNTER — Other Ambulatory Visit: Payer: Self-pay

## 2022-05-30 MED ORDER — AZITHROMYCIN 250 MG PO TABS
ORAL_TABLET | ORAL | 0 refills | Status: DC
  Filled 2022-05-30: qty 6, 5d supply, fill #0

## 2022-05-30 MED ORDER — NAPROXEN 250 MG PO TABS
ORAL_TABLET | ORAL | 0 refills | Status: DC
  Filled 2022-05-30: qty 16, 5d supply, fill #0

## 2022-06-21 ENCOUNTER — Other Ambulatory Visit: Payer: Self-pay | Admitting: Internal Medicine

## 2022-06-21 DIAGNOSIS — Z1231 Encounter for screening mammogram for malignant neoplasm of breast: Secondary | ICD-10-CM

## 2022-06-30 ENCOUNTER — Other Ambulatory Visit: Payer: Self-pay | Admitting: Internal Medicine

## 2022-06-30 ENCOUNTER — Ambulatory Visit
Admission: RE | Admit: 2022-06-30 | Discharge: 2022-06-30 | Disposition: A | Discharge status: Home / Self Care | Payer: Medicare HMO | Source: Ambulatory Visit | Attending: Internal Medicine | Admitting: Internal Medicine

## 2022-06-30 DIAGNOSIS — Z1231 Encounter for screening mammogram for malignant neoplasm of breast: Secondary | ICD-10-CM | POA: Insufficient documentation

## 2022-07-12 ENCOUNTER — Other Ambulatory Visit: Payer: Self-pay

## 2022-07-13 ENCOUNTER — Other Ambulatory Visit: Payer: Self-pay

## 2022-07-14 ENCOUNTER — Other Ambulatory Visit (HOSPITAL_COMMUNITY): Payer: Self-pay

## 2022-07-15 ENCOUNTER — Other Ambulatory Visit: Payer: Self-pay

## 2022-07-15 MED ORDER — AMITRIPTYLINE HCL 10 MG PO TABS
10.0000 mg | ORAL_TABLET | Freq: Every day | ORAL | 3 refills | Status: DC
  Filled 2022-07-15: qty 180, 90d supply, fill #0
  Filled 2022-10-26: qty 180, 90d supply, fill #1
  Filled 2023-01-23: qty 180, 90d supply, fill #2
  Filled 2023-04-09: qty 180, 90d supply, fill #3

## 2022-07-15 MED ORDER — METOPROLOL SUCCINATE ER 50 MG PO TB24
50.0000 mg | ORAL_TABLET | Freq: Every day | ORAL | 3 refills | Status: DC
  Filled 2022-07-15: qty 90, 90d supply, fill #0
  Filled 2022-10-26: qty 90, 90d supply, fill #1
  Filled 2023-01-23: qty 90, 90d supply, fill #2
  Filled 2023-04-09: qty 90, 90d supply, fill #3

## 2022-07-19 ENCOUNTER — Other Ambulatory Visit: Payer: Self-pay

## 2022-08-02 ENCOUNTER — Other Ambulatory Visit: Payer: Self-pay

## 2022-08-03 ENCOUNTER — Other Ambulatory Visit: Payer: Self-pay

## 2022-08-23 ENCOUNTER — Other Ambulatory Visit: Payer: Self-pay

## 2022-08-23 MED ORDER — TRAMADOL HCL 50 MG PO TABS
50.0000 mg | ORAL_TABLET | Freq: Four times a day (QID) | ORAL | 3 refills | Status: DC
  Filled 2022-08-23: qty 100, 25d supply, fill #0
  Filled 2022-10-26: qty 100, 25d supply, fill #1
  Filled 2022-12-05: qty 100, 25d supply, fill #2
  Filled 2023-01-23: qty 100, 25d supply, fill #3

## 2022-08-24 ENCOUNTER — Other Ambulatory Visit: Payer: Self-pay

## 2022-08-24 MED ORDER — LEVOTHYROXINE SODIUM 75 MCG PO TABS
75.0000 ug | ORAL_TABLET | Freq: Every day | ORAL | 3 refills | Status: DC
  Filled 2022-08-24 – 2022-10-27 (×2): qty 90, 90d supply, fill #0
  Filled 2023-01-23: qty 90, 90d supply, fill #1
  Filled 2023-05-01: qty 90, 90d supply, fill #2

## 2022-08-25 ENCOUNTER — Other Ambulatory Visit: Payer: Self-pay

## 2022-08-31 ENCOUNTER — Other Ambulatory Visit: Payer: Self-pay

## 2022-08-31 MED ORDER — HYDROCOD POLI-CHLORPHE POLI ER 10-8 MG/5ML PO SUER
5.0000 mL | Freq: Two times a day (BID) | ORAL | 0 refills | Status: DC
  Filled 2022-08-31: qty 90, 9d supply, fill #0

## 2022-09-01 ENCOUNTER — Other Ambulatory Visit: Payer: Self-pay

## 2022-09-07 ENCOUNTER — Other Ambulatory Visit: Payer: Self-pay

## 2022-09-07 MED ORDER — AZITHROMYCIN 250 MG PO TABS
ORAL_TABLET | ORAL | 0 refills | Status: DC
  Filled 2022-09-07: qty 6, 5d supply, fill #0

## 2022-09-25 ENCOUNTER — Other Ambulatory Visit: Payer: Self-pay

## 2022-10-11 ENCOUNTER — Other Ambulatory Visit: Payer: Self-pay

## 2022-10-11 MED ORDER — CHLORHEXIDINE GLUCONATE 0.12 % MT SOLN
15.0000 mL | Freq: Two times a day (BID) | OROMUCOSAL | 12 refills | Status: DC
  Filled 2022-10-11: qty 473, 16d supply, fill #0

## 2022-10-11 MED ORDER — MOXIFLOXACIN HCL 400 MG PO TABS
400.0000 mg | ORAL_TABLET | Freq: Every day | ORAL | 0 refills | Status: DC
  Filled 2022-10-11: qty 7, 7d supply, fill #0

## 2022-10-26 ENCOUNTER — Other Ambulatory Visit: Payer: Self-pay

## 2022-10-27 ENCOUNTER — Other Ambulatory Visit: Payer: Self-pay

## 2022-10-27 MED ORDER — ZOLPIDEM TARTRATE 10 MG PO TABS
10.0000 mg | ORAL_TABLET | Freq: Every day | ORAL | 2 refills | Status: DC
  Filled 2022-10-27: qty 90, 90d supply, fill #0
  Filled 2023-01-23: qty 90, 90d supply, fill #1
  Filled 2023-04-09: qty 90, 90d supply, fill #2

## 2022-10-30 ENCOUNTER — Other Ambulatory Visit: Payer: Self-pay

## 2022-10-31 ENCOUNTER — Other Ambulatory Visit: Payer: Self-pay

## 2022-10-31 MED ORDER — LOSARTAN POTASSIUM 50 MG PO TABS
50.0000 mg | ORAL_TABLET | Freq: Every day | ORAL | 3 refills | Status: DC
  Filled 2022-10-31: qty 90, 90d supply, fill #0
  Filled 2023-04-09: qty 90, 90d supply, fill #1
  Filled 2023-06-28: qty 90, 90d supply, fill #2

## 2022-10-31 MED ORDER — ROSUVASTATIN CALCIUM 10 MG PO TABS
10.0000 mg | ORAL_TABLET | Freq: Every evening | ORAL | 3 refills | Status: DC
  Filled 2022-10-31: qty 90, 90d supply, fill #0
  Filled 2023-01-23: qty 90, 90d supply, fill #1
  Filled 2023-06-28: qty 90, 90d supply, fill #2

## 2022-11-01 ENCOUNTER — Other Ambulatory Visit: Payer: Self-pay

## 2022-11-10 IMAGING — CR DG CHEST 2V
1 series · 2 of 2 positions shown · non-contrast
Comparison: Chest x-ray 02/22/2011

CLINICAL DATA: cough

EXAM:
CHEST - 2 VIEW

[Series 1: dg chest 2 view · 0.14mm/px · 2 of 2 slices shown]
[im 1/2]
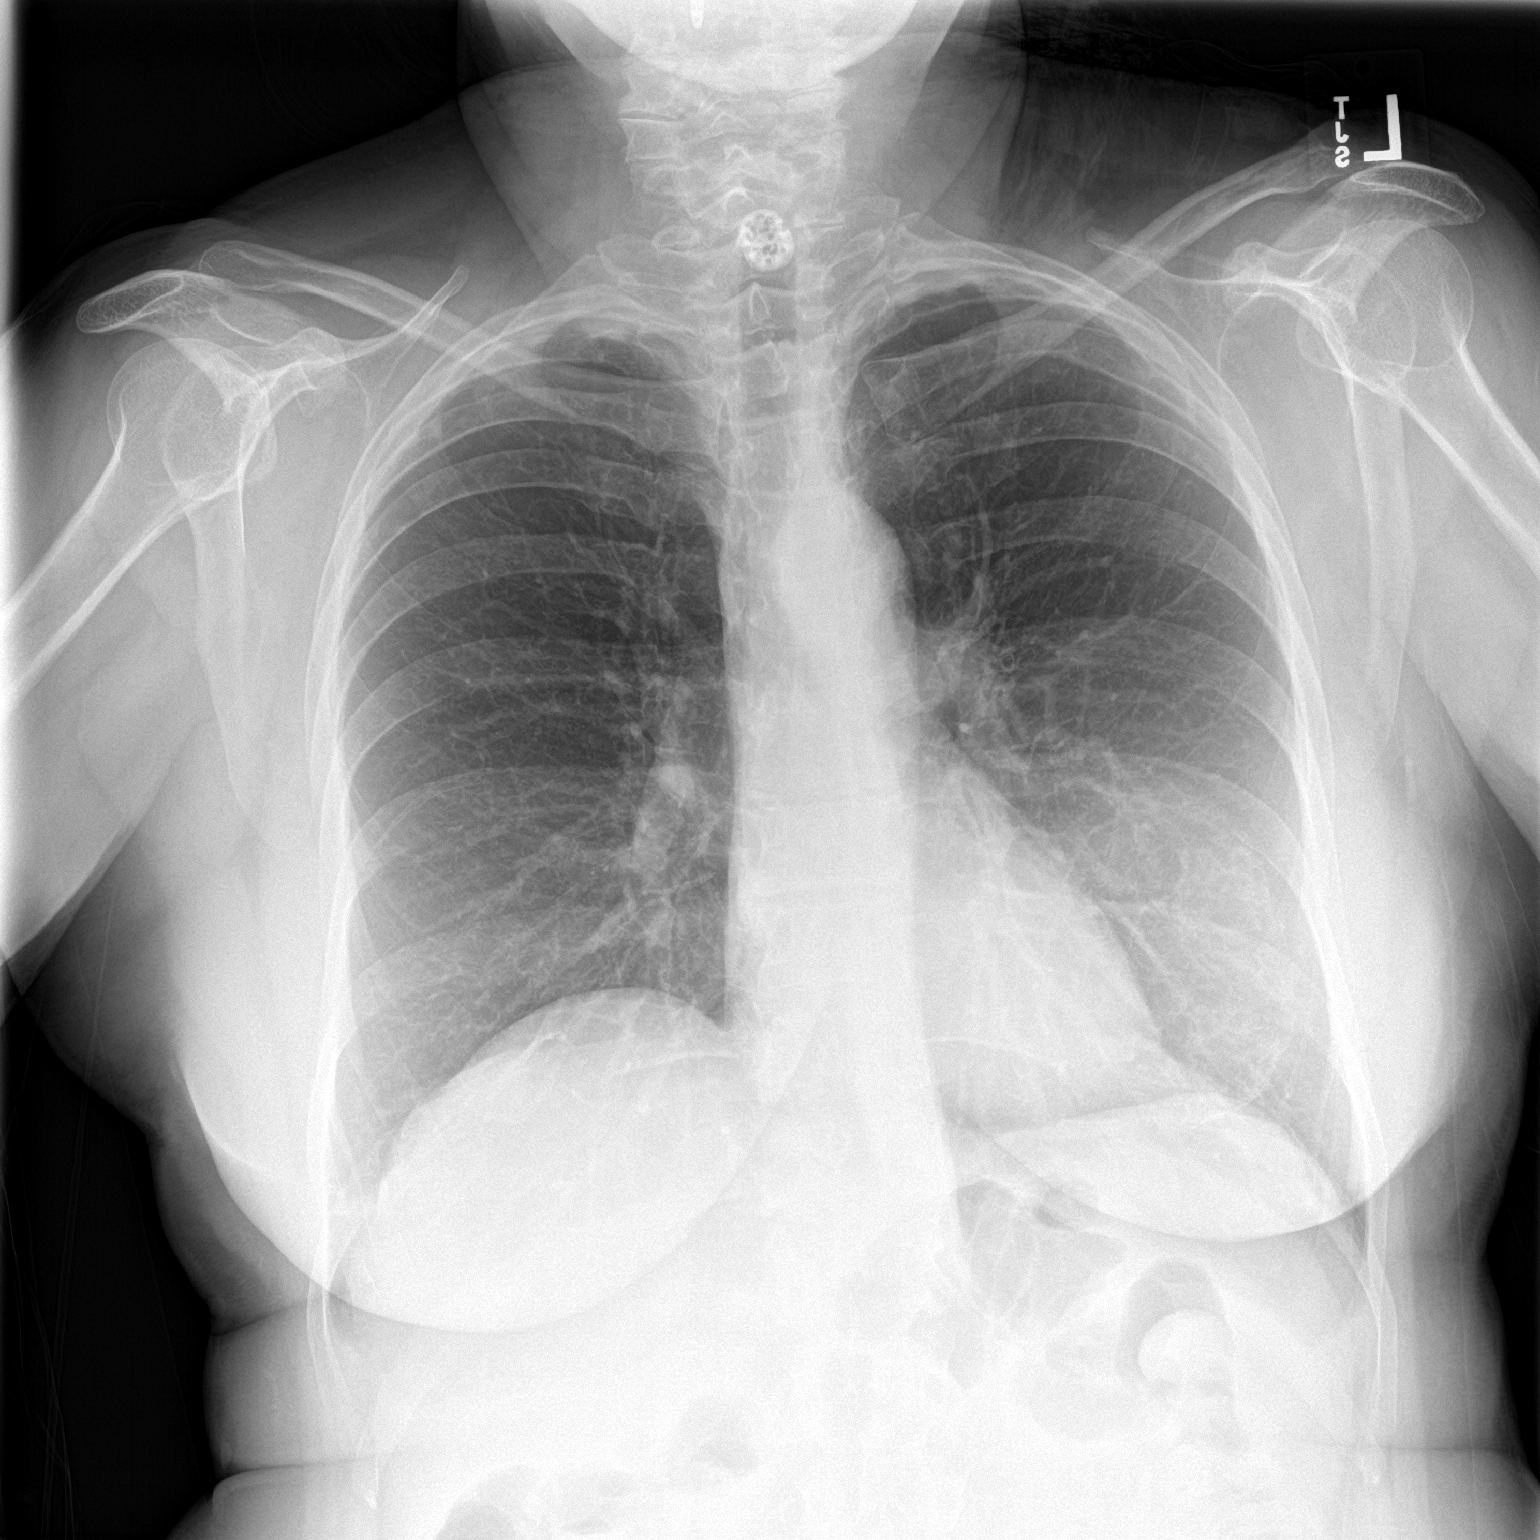
[im 2/2]
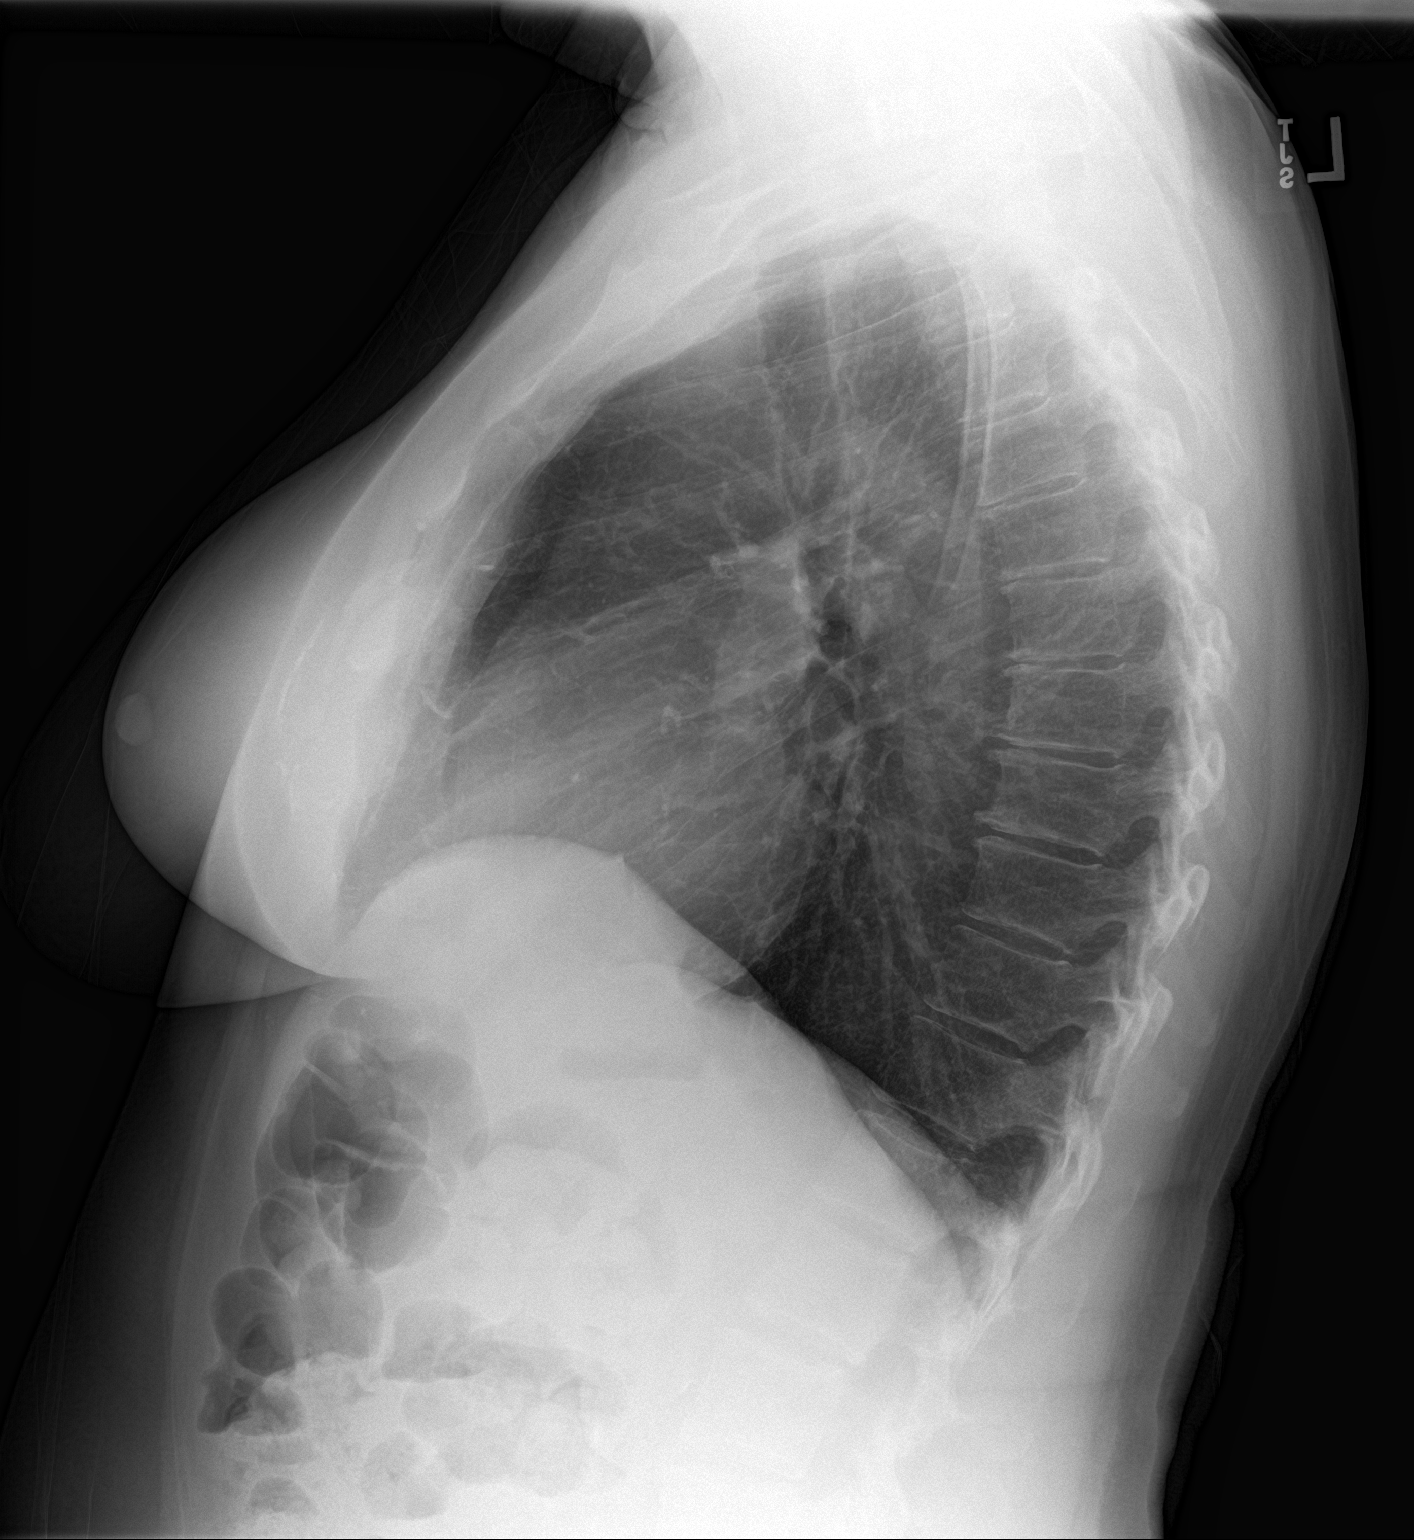

[2 of 2 positions shown; findings below may reference images not displayed]

FINDINGS: The heart and mediastinal contours are unchanged.

Biapical pleural/pulmonary scarring. No focal consolidation. No
pulmonary edema. No pleural effusion. No pneumothorax.

No acute osseous abnormality. Likely surgical changes of prior
tracheostomy noted again noted overlying the neck/trachea.
IMPRESSION: No active cardiopulmonary disease.

## 2022-11-23 ENCOUNTER — Other Ambulatory Visit: Payer: Self-pay

## 2022-12-05 ENCOUNTER — Other Ambulatory Visit: Payer: Self-pay

## 2023-01-23 ENCOUNTER — Other Ambulatory Visit: Payer: Self-pay

## 2023-01-24 ENCOUNTER — Other Ambulatory Visit: Payer: Self-pay

## 2023-01-26 ENCOUNTER — Other Ambulatory Visit: Payer: Self-pay

## 2023-01-26 MED ORDER — CHLORHEXIDINE GLUCONATE 0.12 % MT SOLN
OROMUCOSAL | 12 refills | Status: DC
  Filled 2023-01-26: qty 473, 15d supply, fill #0

## 2023-01-26 MED ORDER — NAPROXEN 250 MG PO TABS
250.0000 mg | ORAL_TABLET | ORAL | 0 refills | Status: DC
  Filled 2023-01-26: qty 16, 5d supply, fill #0

## 2023-01-26 MED ORDER — MOXIFLOXACIN HCL 400 MG PO TABS
400.0000 mg | ORAL_TABLET | Freq: Every day | ORAL | 0 refills | Status: DC
  Filled 2023-01-26: qty 7, 7d supply, fill #0

## 2023-02-14 ENCOUNTER — Other Ambulatory Visit: Payer: Self-pay

## 2023-02-14 MED ORDER — MOXIFLOXACIN HCL 400 MG PO TABS
400.0000 mg | ORAL_TABLET | Freq: Every day | ORAL | 0 refills | Status: DC
  Filled 2023-02-14: qty 7, 7d supply, fill #0

## 2023-02-16 ENCOUNTER — Other Ambulatory Visit: Payer: Self-pay

## 2023-02-17 ENCOUNTER — Other Ambulatory Visit: Payer: Self-pay

## 2023-03-08 ENCOUNTER — Other Ambulatory Visit: Payer: Self-pay

## 2023-03-08 MED ORDER — TRAMADOL HCL 50 MG PO TABS
50.0000 mg | ORAL_TABLET | Freq: Four times a day (QID) | ORAL | 3 refills | Status: DC
  Filled 2023-03-08: qty 90, 23d supply, fill #0
  Filled 2023-04-09: qty 90, 23d supply, fill #1
  Filled 2023-05-01: qty 90, 23d supply, fill #2
  Filled 2023-06-28: qty 90, 23d supply, fill #3
  Filled 2023-07-25: qty 40, 10d supply, fill #4

## 2023-03-10 ENCOUNTER — Other Ambulatory Visit: Payer: Self-pay

## 2023-04-09 ENCOUNTER — Other Ambulatory Visit: Payer: Self-pay

## 2023-04-10 ENCOUNTER — Other Ambulatory Visit: Payer: Self-pay

## 2023-04-11 ENCOUNTER — Other Ambulatory Visit: Payer: Self-pay

## 2023-04-11 MED ORDER — ESTRADIOL 2 MG PO TABS
2.0000 mg | ORAL_TABLET | Freq: Every day | ORAL | 4 refills | Status: AC
Start: 2023-04-11 — End: ?
  Filled 2023-04-11: qty 90, 90d supply, fill #0
  Filled 2023-07-03: qty 90, 90d supply, fill #1
  Filled 2023-09-14 – 2023-09-18 (×2): qty 90, 90d supply, fill #2
  Filled 2023-10-24 – 2024-01-08 (×2): qty 90, 90d supply, fill #3

## 2023-04-11 MED ORDER — CEFDINIR 300 MG PO CAPS
300.0000 mg | ORAL_CAPSULE | Freq: Two times a day (BID) | ORAL | 0 refills | Status: DC
  Filled 2023-04-11: qty 30, 15d supply, fill #0

## 2023-04-25 ENCOUNTER — Other Ambulatory Visit: Payer: Self-pay

## 2023-04-25 DIAGNOSIS — I10 Essential (primary) hypertension: Secondary | ICD-10-CM | POA: Insufficient documentation

## 2023-04-25 DIAGNOSIS — E785 Hyperlipidemia, unspecified: Secondary | ICD-10-CM | POA: Insufficient documentation

## 2023-04-25 DIAGNOSIS — M797 Fibromyalgia: Secondary | ICD-10-CM | POA: Insufficient documentation

## 2023-04-25 MED ORDER — NA SULFATE-K SULFATE-MG SULF 17.5-3.13-1.6 GM/177ML PO SOLN
1.0000 | ORAL | 0 refills | Status: DC
  Filled 2023-04-25: qty 354, 1d supply, fill #0

## 2023-05-01 ENCOUNTER — Other Ambulatory Visit: Payer: Self-pay

## 2023-05-02 ENCOUNTER — Other Ambulatory Visit: Payer: Self-pay

## 2023-05-02 MED ORDER — ZOLPIDEM TARTRATE 10 MG PO TABS
10.0000 mg | ORAL_TABLET | Freq: Every evening | ORAL | 1 refills | Status: DC
  Filled 2023-05-02: qty 90, 90d supply, fill #0
  Filled 2023-06-28 – 2023-07-31 (×3): qty 90, 90d supply, fill #1

## 2023-05-04 ENCOUNTER — Other Ambulatory Visit: Payer: Self-pay

## 2023-05-19 ENCOUNTER — Encounter (HOSPITAL_BASED_OUTPATIENT_CLINIC_OR_DEPARTMENT_OTHER): Payer: Self-pay | Admitting: Internal Medicine

## 2023-05-19 ENCOUNTER — Other Ambulatory Visit: Payer: Self-pay | Admitting: Internal Medicine

## 2023-05-19 ENCOUNTER — Other Ambulatory Visit: Payer: Self-pay

## 2023-05-19 ENCOUNTER — Encounter: Payer: Self-pay | Admitting: Internal Medicine

## 2023-05-19 DIAGNOSIS — R7989 Other specified abnormal findings of blood chemistry: Secondary | ICD-10-CM

## 2023-05-19 MED ORDER — LEVOTHYROXINE SODIUM 100 MCG PO TABS
100.0000 ug | ORAL_TABLET | Freq: Every day | ORAL | 1 refills | Status: AC
  Filled 2023-05-19: qty 90, 90d supply, fill #0
  Filled 2023-06-28 – 2023-08-24 (×2): qty 90, 90d supply, fill #1

## 2023-05-26 ENCOUNTER — Ambulatory Visit
Admission: RE | Admit: 2023-05-26 | Discharge: 2023-05-26 | Disposition: A | Discharge status: Home / Self Care | Source: Ambulatory Visit | Attending: Internal Medicine | Admitting: Internal Medicine

## 2023-05-26 DIAGNOSIS — R7989 Other specified abnormal findings of blood chemistry: Secondary | ICD-10-CM | POA: Insufficient documentation

## 2023-06-28 ENCOUNTER — Other Ambulatory Visit: Payer: Self-pay

## 2023-06-29 ENCOUNTER — Other Ambulatory Visit: Payer: Self-pay

## 2023-06-29 MED ORDER — VENLAFAXINE HCL ER 150 MG PO CP24
150.0000 mg | ORAL_CAPSULE | Freq: Every day | ORAL | 4 refills | Status: DC
  Filled 2023-06-29 – 2023-07-25 (×2): qty 90, 90d supply, fill #0

## 2023-06-29 MED ORDER — AMITRIPTYLINE HCL 10 MG PO TABS
10.0000 mg | ORAL_TABLET | Freq: Every day | ORAL | 3 refills | Status: AC
  Filled 2023-06-29: qty 180, 90d supply, fill #0
  Filled 2023-10-24: qty 180, 90d supply, fill #1
  Filled 2024-01-08: qty 180, 90d supply, fill #2

## 2023-06-29 MED ORDER — METOPROLOL SUCCINATE ER 50 MG PO TB24
50.0000 mg | ORAL_TABLET | Freq: Every day | ORAL | 3 refills | Status: AC
  Filled 2023-06-29: qty 90, 90d supply, fill #0
  Filled 2023-10-24: qty 90, 90d supply, fill #1
  Filled 2024-01-08: qty 90, 90d supply, fill #2

## 2023-06-30 ENCOUNTER — Other Ambulatory Visit: Payer: Self-pay

## 2023-07-03 ENCOUNTER — Other Ambulatory Visit: Payer: Self-pay

## 2023-07-26 ENCOUNTER — Other Ambulatory Visit: Payer: Self-pay

## 2023-07-27 ENCOUNTER — Other Ambulatory Visit: Payer: Self-pay

## 2023-07-27 MED ORDER — LOSARTAN POTASSIUM 50 MG PO TABS
50.0000 mg | ORAL_TABLET | Freq: Every day | ORAL | 3 refills | Status: AC
Start: 2023-07-06 — End: ?
  Filled 2023-09-19: qty 90, 90d supply, fill #0
  Filled 2024-01-08: qty 90, 90d supply, fill #1

## 2023-07-27 MED ORDER — TRAMADOL HCL 50 MG PO TABS
50.0000 mg | ORAL_TABLET | Freq: Four times a day (QID) | ORAL | 4 refills | Status: AC | PRN
  Filled 2023-07-27: qty 90, 23d supply, fill #0
  Filled 2023-09-14: qty 90, 23d supply, fill #1
  Filled 2023-10-24: qty 90, 23d supply, fill #2
  Filled 2023-11-09 – 2023-11-15 (×3): qty 90, 23d supply, fill #3

## 2023-07-27 MED ORDER — ZOLPIDEM TARTRATE 10 MG PO TABS
10.0000 mg | ORAL_TABLET | Freq: Every day | ORAL | 2 refills | Status: DC
  Filled 2023-08-03: qty 90, 90d supply, fill #0
  Filled 2023-10-24: qty 90, 90d supply, fill #1

## 2023-07-27 MED ORDER — VENLAFAXINE HCL ER 150 MG PO CP24
150.0000 mg | ORAL_CAPSULE | Freq: Every day | ORAL | 3 refills | Status: AC
Start: 2023-07-06 — End: ?
  Filled 2023-10-24: qty 90, 90d supply, fill #0
  Filled 2024-01-08: qty 90, 90d supply, fill #1

## 2023-07-27 MED ORDER — LEVOTHYROXINE SODIUM 100 MCG PO TABS
100.0000 ug | ORAL_TABLET | Freq: Every morning | ORAL | 3 refills | Status: AC
  Filled 2023-10-24 – 2023-11-09 (×2): qty 90, 90d supply, fill #0
  Filled 2024-01-08 – 2024-02-11 (×2): qty 90, 90d supply, fill #1

## 2023-07-27 MED ORDER — ROSUVASTATIN CALCIUM 10 MG PO TABS
10.0000 mg | ORAL_TABLET | Freq: Every evening | ORAL | 3 refills | Status: AC
  Filled 2023-09-19: qty 90, 90d supply, fill #0
  Filled 2024-01-08: qty 90, 90d supply, fill #1

## 2023-07-27 MED ORDER — TIZANIDINE HCL 4 MG PO TABS
4.0000 mg | ORAL_TABLET | Freq: Every evening | ORAL | 1 refills | Status: AC | PRN
  Filled 2023-11-09: qty 90, 90d supply, fill #0

## 2023-07-31 ENCOUNTER — Other Ambulatory Visit: Payer: Self-pay

## 2023-08-01 NOTE — Progress Notes (Unsigned)
 Referring Physician:  No referring provider defined for this encounter.  Primary Physician:  Corlis Honor BROCKS, MD  History of Present Illness: 08/01/2023 [name] is here today with a chief complaint of ***  Neck pain Arm pain?   Duration: *** Location: *** Quality: *** Severity: ***  Precipitating: aggravated by *** Modifying factors: made better by *** Weakness: none Timing: *** Bowel/Bladder Dysfunction: none  Conservative measures:  Physical therapy:   has not participated in PT Multimodal medical therapy including regular antiinflammatories: Tramadol , Tizanidine , Naproxen   Injections:  no epidural steroid injections  Past Surgery:  none  Regina Brown has ***no symptoms of cervical myelopathy.  The symptoms are causing a significant impact on the patient's life.   Review of Systems:  A 10 point review of systems is negative, except for the pertinent positives and negatives detailed in the HPI.  Past Medical History: No past medical history on file.  Past Surgical History: Past Surgical History:  Procedure Laterality Date   AUGMENTATION MAMMAPLASTY     left side only    Allergies: Allergies as of 08/03/2023 - Review Complete 07/02/2021  Allergen Reaction Noted   Augmentin [amoxicillin-pot clavulanate]  12/29/2014    Medications: Outpatient Encounter Medications as of 08/03/2023  Medication Sig   amitriptyline  (ELAVIL ) 10 MG tablet    amitriptyline  (ELAVIL ) 10 MG tablet TAKE 1 TO 2 TABLETS BY MOUTH AT BEDTIME   amitriptyline  (ELAVIL ) 10 MG tablet Take 1-2 tablets (10-20 mg total) by mouth at bedtime.   azithromycin  (ZITHROMAX  Z-PAK) 250 MG tablet As directed   azithromycin  (ZITHROMAX ) 250 MG tablet AZITHROMYCIN  250 MG ORAL TABLET- BEGINNING AM OF PROCEDURE TAKE 2 TABLETS ON DAY 1 THEN TAKE 1 TABLET A DAY FOR 4 DAYS   azithromycin  (ZITHROMAX ) 250 MG tablet AZITHROMYCIN  250 MG ORAL TABLET- BEGINNING AM OF PROCEDURE TAKE 2 TABLETS ON DAY 1 THEN  TAKE 1 TABLET A DAY FOR 4 DAYS   azithromycin  (ZITHROMAX ) 250 MG tablet TAKE 2 TABLETS ON DAY 1 THEN TAKE 1 TABLET A DAY FOR 4 DAYS.   cefdinir  (OMNICEF ) 300 MG capsule Take 1 capsule by mouth twice a day for 10 days   cefdinir  (OMNICEF ) 300 MG capsule Take 1 capsule (300 mg total) by mouth 2 (two) times daily.   chlorhexidine  (PERIDEX ) 0.12 % solution Rinse and expectorate with15 mLs 2 (two) times daily.   chlorhexidine  (PERIDEX ) 0.12 % solution Rinse and spit with 15 mLs by Mouth Rinse route 2 (two) times daily.   chlorhexidine  (PERIDEX ) 0.12 % solution RINSE WITH 1/2 OZ. AND EXPECTORATE TWICE A DAY   chlorpheniramine-HYDROcodone (TUSSIONEX) 10-8 MG/5ML SUER take 5 milliliters by oral route every 12 hours   chlorpheniramine-HYDROcodone (TUSSIONEX) 10-8 MG/5ML Take 5 mLs by mouth every 12 (twelve) hours.   cloNIDine  (CATAPRES ) 0.1 MG tablet TAKE 1 TABLET BY MOUTH TWICE DAILY AS NEEDED. SYS BLOOD PRESSURE: >175   doxycycline  (VIBRA -TABS) 100 MG tablet Take 1 tablet (100 mg total) by mouth 2 (two) times daily. (Patient not taking: Reported on 07/22/2016)   doxycycline  (VIBRAMYCIN ) 100 MG capsule    DULoxetine  (CYMBALTA ) 30 MG capsule TAKE 3 CAPSULES (90MG ) BY MOUTH ONCE A DAY   DULoxetine  (CYMBALTA ) 30 MG capsule Take 3 capsules (90 mg total) by mouth daily.   EST ESTROGENS -METHYLTEST HS 0.625-1.25 MG tablet    estradiol  (ESTRACE ) 2 MG tablet Take 1 tablet (2 mg total) by mouth daily.   estrogen-methylTESTOSTERone  0.625-1.25 MG tablet TAKE 1 TABLET BY MOUTH ONCE DAILY  fluticasone  (FLONASE ) 50 MCG/ACT nasal spray Place 2 sprays into both nostrils daily.   levothyroxine  (SYNTHROID ) 100 MCG tablet Take 1 tablet (100 mcg total) by mouth daily.   levothyroxine  (SYNTHROID ) 100 MCG tablet Take 1 tablet (100 mcg total) by mouth every morning.   levothyroxine  (SYNTHROID ) 75 MCG tablet Take one tablet by mouth daily.   levothyroxine  (SYNTHROID ) 75 MCG tablet Take 1 tablet (75 mcg total) by mouth daily.    levothyroxine  (SYNTHROID , LEVOTHROID) 75 MCG tablet    losartan  (COZAAR ) 50 MG tablet Take 1 tablet (50 mg total) by mouth daily.   losartan  (COZAAR ) 50 MG tablet Take 1 tablet (50 mg total) by mouth daily.   metoprolol  succinate (TOPROL -XL) 25 MG 24 hr tablet TAKE 1 TABLET BY MOUTH ONCE DAILY   metoprolol  succinate (TOPROL -XL) 50 MG 24 hr tablet Take 1 tablet (50 mg total) by mouth daily.   moxifloxacin  (AVELOX ) 400 MG tablet Take 1 tablet (400mg ) by mouth once daily for 7 days.   moxifloxacin  (AVELOX ) 400 MG tablet Take 1 tablet (400 mg total) by mouth daily.   Na Sulfate-K Sulfate-Mg Sulfate concentrate (SUPREP) 17.5-3.13-1.6 GM/177ML SOLN Take 1 Bottle by mouth as directed One kit contains 2 bottles.  Take both bottles at the times instructed by your provider.   naproxen  (NAPROSYN ) 250 MG tablet TAKE 2 TABLETS AM OF PROCEDURE AND 1 TABLET 3 TIMES A DAY AS NEEDED FOR PAIN. DO NOT EXCEED 5 TABLETS IN A 24 HOUR PERIOD.   naproxen  (NAPROSYN ) 250 MG tablet TAKE 2 TABLETS AM OF PROCEDURE AND 1 TABLET 3 TIMES A DAY AS NEEDED FOR PAIN. DO NOT EXCEED 5 TABLETS IN A 24 HOUR PERIOD.   naproxen  (NAPROSYN ) 250 MG tablet TAKE 2 TABLETS by mouth morning OF PROCEDURE AND 1 TABLET 3 TIMES A DAY AS NEEDED FOR PAIN. DO NOT EXCEED 5 TABLETS IN A 24 HOUR PERIOD.   ondansetron  (ZOFRAN  ODT) 4 MG disintegrating tablet Take 1 tablet (4 mg total) by mouth every 8 (eight) hours as needed for nausea or vomiting. (Patient not taking: Reported on 07/22/2016)   predniSONE  (DELTASONE ) 10 MG tablet Take 3 tablets (30 mg total) by mouth daily with breakfast.   rosuvastatin  (CRESTOR ) 10 MG tablet TAKE 1 TABLET BY MOUTH EVERY EVENING   rosuvastatin  (CRESTOR ) 10 MG tablet Take 1 tablet by mouth every evening   rosuvastatin  (CRESTOR ) 10 MG tablet Take 1 tablet (10 mg total) by mouth every evening.   tiZANidine  (ZANAFLEX ) 4 MG tablet Take 1 tablet (4 mg total) by mouth at bedtime as needed for neck strain.   traMADol  (ULTRAM ) 50 MG  tablet Take by mouth every 6 (six) hours as needed.   traMADol  (ULTRAM ) 50 MG tablet Take 1 tablet by mouth every 6 hours as needed for pain   traMADol  (ULTRAM ) 50 MG tablet Take 1 tablet (50 mg total) by mouth every 6 to 8  hours.   traMADol  (ULTRAM ) 50 MG tablet Take 1 tablet (50 mg total) by mouth every 6-8 (six) hours.   traMADol  (ULTRAM ) 50 MG tablet Take 1 tablet (50 mg total) by mouth every 6 (six) to 8 (eight) hours as needed.   venlafaxine  XR (EFFEXOR -XR) 150 MG 24 hr capsule Take 1 capsule (150 mg total) by mouth daily.   venlafaxine  XR (EFFEXOR -XR) 150 MG 24 hr capsule Take 1 capsule (150 mg total) by mouth daily.   venlafaxine  XR (EFFEXOR -XR) 37.5 MG 24 hr capsule    venlafaxine  XR (EFFEXOR -XR) 75 MG 24  hr capsule Take 1 capsule (75 mg total) by mouth daily.   zolpidem  (AMBIEN ) 10 MG tablet Take 10 mg by mouth at bedtime as needed for sleep.   zolpidem  (AMBIEN ) 10 MG tablet TAKE 1/2 TO 1 TABLET BY MOUTH AT BEDTIME   zolpidem  (AMBIEN ) 10 MG tablet Take 1 tablet (10 mg total) by mouth at bedtime as needed.   zolpidem  (AMBIEN ) 10 MG tablet Take 1 tablet (10 mg total) by mouth at bedtime.   zolpidem  (AMBIEN ) 10 MG tablet Take 1 tablet (10 mg total) by mouth at bedtime.   zolpidem  (AMBIEN ) 10 MG tablet Take 1 tablet (10 mg total) by mouth at bedtime.   [DISCONTINUED] amLODipine  (NORVASC ) 5 MG tablet Take 1 tablet by mouth daily   No facility-administered encounter medications on file as of 08/03/2023.    Social History: Social History   Tobacco Use   Smoking status: Never   Smokeless tobacco: Never    Family Medical History: Family History  Problem Relation Age of Onset   Breast cancer Maternal Aunt     Physical Examination: @VITALWITHPAIN @  General: Patient is well developed, well nourished, calm, collected, and in no apparent distress. Attention to examination is appropriate.  Psychiatric: Patient is non-anxious.  Head:  Pupils equal, round, and reactive to  light.  ENT:  Oral mucosa appears well hydrated.  Neck:   Supple.  ***Full range of motion.  Respiratory: Patient is breathing without any difficulty.  Extremities: No edema.  Vascular: Palpable dorsal pedal pulses.  Skin:   On exposed skin, there are no abnormal skin lesions.  NEUROLOGICAL:     Awake, alert, oriented to person, place, and time.  Speech is clear and fluent. Fund of knowledge is appropriate.   Cranial Nerves: Pupils equal round and reactive to light.  Facial tone is symmetric.  Facial sensation is symmetric.  ROM of spine: ***full.  Palpation of spine: ***non tender.    Strength: Side Biceps Triceps Deltoid Interossei Grip Wrist Ext. Wrist Flex.  R 5 5 5 5 5 5 5   L 5 5 5 5 5 5 5    Side Iliopsoas Quads Hamstring PF DF EHL  R 5 5 5 5 5 5   L 5 5 5 5 5 5    Reflexes are ***2+ and symmetric at the biceps, triceps, brachioradialis, patella and achilles.   Hoffman's is absent.  Clonus is not present.  Toes are down-going.  Bilateral upper and lower extremity sensation is intact to light touch.    Gait is normal.   No difficulty with tandem gait.   No evidence of dysmetria noted.  Medical Decision Making  Imaging: ***  I have personally reviewed the images and agree with the above interpretation.  Assessment and Plan: Ms. Haxton is a pleasant 68 y.o. female with ***    Thank you for involving me in the care of this patient.   I spent a total of *** minutes in both face-to-face and non-face-to-face activities for this visit on the date of this encounter.   [employer] [facility]

## 2023-08-03 ENCOUNTER — Ambulatory Visit
Admission: RE | Admit: 2023-08-03 | Discharge: 2023-08-03 | Disposition: A | Discharge status: Home / Self Care | Source: Ambulatory Visit | Attending: Neurosurgery | Admitting: Neurosurgery

## 2023-08-03 ENCOUNTER — Ambulatory Visit
Admission: RE | Admit: 2023-08-03 | Discharge: 2023-08-03 | Disposition: A | Discharge status: Home / Self Care | Attending: Neurosurgery | Admitting: Neurosurgery

## 2023-08-03 ENCOUNTER — Other Ambulatory Visit: Payer: Self-pay

## 2023-08-03 ENCOUNTER — Encounter: Payer: Self-pay | Admitting: Neurosurgery

## 2023-08-03 ENCOUNTER — Ambulatory Visit: Admitting: Neurosurgery

## 2023-08-03 VITALS — BP 118/72 | Ht 64.0 in | Wt 155.2 lb

## 2023-08-03 DIAGNOSIS — M542 Cervicalgia: Secondary | ICD-10-CM | POA: Diagnosis present

## 2023-08-03 DIAGNOSIS — Z981 Arthrodesis status: Secondary | ICD-10-CM

## 2023-08-03 DIAGNOSIS — M5010 Cervical disc disorder with radiculopathy, unspecified cervical region: Secondary | ICD-10-CM

## 2023-08-03 DIAGNOSIS — M5412 Radiculopathy, cervical region: Secondary | ICD-10-CM

## 2023-08-04 ENCOUNTER — Other Ambulatory Visit: Payer: Self-pay

## 2023-08-05 ENCOUNTER — Inpatient Hospital Stay
Admission: RE | Admit: 2023-08-05 | Discharge: 2023-08-05 | Discharge status: Home / Self Care | Payer: Self-pay | Attending: Emergency Medicine

## 2023-08-05 VITALS — BP 137/69 | HR 79 | Temp 99.2°F | Resp 18

## 2023-08-05 DIAGNOSIS — S91209A Unspecified open wound of unspecified toe(s) with damage to nail, initial encounter: Secondary | ICD-10-CM | POA: Diagnosis not present

## 2023-08-05 MED ORDER — DOXYCYCLINE HYCLATE 100 MG PO CAPS: 100.0000 mg | ORAL_CAPSULE | Freq: Two times a day (BID) | ORAL | 0 refills | Status: AC

## 2023-08-05 MED ORDER — HYDROCODONE-ACETAMINOPHEN 5-325 MG PO TABS: 2.0000 | ORAL_TABLET | ORAL | 0 refills | Status: DC | PRN

## 2023-08-05 NOTE — ED Triage Notes (Signed)
 Pt being seen in UC for L foot injury that occurred at home. Pt reports that foot was jammed into cabinet in kitchen and toenail was torn back. Pt reports cutting broken part of nail off with cuticle cutters. Pt reports taking motrin and ultram  with no relief. Pt reports 5/10 pain in L foot.

## 2023-08-05 NOTE — ED Provider Notes (Signed)
 Regina Brown    CSN: [id number] Arrival date & time: 08/05/23  1157      History   Chief Complaint Chief Complaint  Patient presents with   Foot Injury    Nail on left 3rd toe torn from the nail bed. A lot of pain! - Entered by patient    HPI Regina Brown is a 68 y.o. female.  Patient presents with partial nail avulsion of her left third toe after she accidentally hit it on a kitchen cabinet yesterday.  The nail partially separated from the nailbed.  She clipped off the part of the nail that was sticking up.  She was unable to sleep last night due to the pain in the toe.  She took ibuprofen and Ultram  without relief.  No fever, purulent drainage numbness, weakness.  The history is provided by the patient and medical records.    Past Medical History:  Diagnosis Date   Fibromyalgia    Hyperlipidemia    Hypertension     Patient Active Problem List   Diagnosis Date Noted   Essential hypertension 04/25/2023   Fibromyalgia 04/25/2023   Hyperlipemia 04/25/2023    Past Surgical History:  Procedure Laterality Date   AUGMENTATION MAMMAPLASTY     left side only   CERVICAL DISC SURGERY  03/2008   Dr Kathi   LAPAROSCOPIC TOTAL HYSTERECTOMY     2010    OB History   No obstetric history on file.      Home Medications    Prior to Admission medications   Medication Sig Start Date End Date Taking? Authorizing Provider  doxycycline  (VIBRAMYCIN ) 100 MG capsule Take 1 capsule (100 mg total) by mouth 2 (two) times daily for 7 days. 08/05/23 08/12/23 Yes Corlis Burnard DEL, NP  HYDROcodone -acetaminophen  (NORCO/VICODIN) 5-325 MG tablet Take 2 tablets by mouth every 4 (four) hours as needed. 08/05/23  Yes Corlis Burnard DEL, NP  amitriptyline  (ELAVIL ) 10 MG tablet Take 1-2 tablets (10-20 mg total) by mouth at bedtime. 06/29/23   Corlis Honor BROCKS, MD  estradiol  (ESTRACE ) 2 MG tablet Take 1 tablet (2 mg total) by mouth daily. 04/11/23   Corlis Honor BROCKS, MD  fluticasone  (FLONASE ) 50 MCG/ACT  nasal spray Place 2 sprays into both nostrils daily. 07/22/16   Gasper Devere ORN, PA-C  levothyroxine  (SYNTHROID ) 100 MCG tablet Take 100 mcg by mouth daily before breakfast. 05/19/23   [provider]  losartan  (COZAAR ) 50 MG tablet Take 1 tablet (50 mg total) by mouth daily. 10/27/22     metoprolol  succinate (TOPROL -XL) 50 MG 24 hr tablet Take 1 tablet (50 mg total) by mouth daily. 06/29/23   Corlis Honor BROCKS, MD  rosuvastatin  (CRESTOR ) 10 MG tablet Take 1 tablet (10 mg total) by mouth every evening. 07/06/23   Corlis Honor BROCKS, MD  tiZANidine  (ZANAFLEX ) 4 MG tablet Take 1 tablet (4 mg total) by mouth at bedtime as needed for neck strain. 07/06/23   Corlis Honor BROCKS, MD  traMADol  (ULTRAM ) 50 MG tablet Take 1 tablet (50 mg total) by mouth every 6 (six) to 8 (eight) hours as needed. 07/06/23   Corlis Honor BROCKS, MD  venlafaxine  XR (EFFEXOR -XR) 150 MG 24 hr capsule Take 1 capsule (150 mg total) by mouth daily. 07/06/23   Corlis Honor BROCKS, MD  zolpidem  (AMBIEN ) 10 MG tablet Take 1 tablet (10 mg total) by mouth at bedtime. 07/06/23     amLODipine  (NORVASC ) 5 MG tablet Take 1 tablet by mouth daily 01/08/21 10/19/21  Corlis Honor BROCKS, MD    Family History Family History  Problem Relation Age of Onset   Breast cancer Maternal Aunt     Social History Social History   Tobacco Use   Smoking status: Former    Types: Cigarettes   Smokeless tobacco: Never  Vaping Use   Vaping status: Never Used  Substance Use Topics   Alcohol use: Yes    Comment: rarely   Drug use: Never     Allergies   Augmentin [amoxicillin-pot clavulanate]   Review of Systems Review of Systems  Constitutional:  Negative for chills and fever.  Musculoskeletal:  Positive for arthralgias, gait problem and joint swelling.  Skin:  Positive for color change and wound.  Neurological:  Negative for weakness and numbness.     Physical Exam Triage Vital Signs ED Triage Vitals  Encounter Vitals Group     BP 08/05/23 1211 137/69     Girls  Systolic BP Percentile --      Girls Diastolic BP Percentile --      Boys Systolic BP Percentile --      Boys Diastolic BP Percentile --      Pulse Rate 08/05/23 1211 79     Resp 08/05/23 1211 18     Temp 08/05/23 1211 99.2 F (37.3 C)     Temp Source 08/05/23 1211 Oral     SpO2 08/05/23 1211 99 %     Weight --      Height --      Head Circumference --      Peak Flow --      Pain Score 08/05/23 1207 5     Pain Loc --      Pain Education --      Exclude from Growth Chart --    No data found.  Updated Vital Signs BP 137/69 (BP Location: Right Arm)   Pulse 79   Temp 99.2 F (37.3 C) (Oral)   Resp 18   SpO2 99%   Visual Acuity Right Eye Distance:   Left Eye Distance:   Bilateral Distance:    Right Eye Near:   Left Eye Near:    Bilateral Near:     Physical Exam Constitutional:      General: She is not in acute distress. HENT:     Mouth/Throat:     Mouth: Mucous membranes are moist.  Cardiovascular:     Rate and Rhythm: Normal rate and regular rhythm.  Pulmonary:     Effort: Pulmonary effort is normal. No respiratory distress.  Musculoskeletal:        General: Tenderness present. No swelling or deformity. Normal range of motion.  Skin:    General: Skin is warm and dry.     Capillary Refill: Capillary refill takes less than 2 seconds.     Findings: Bruising present. No lesion.     Comments: Left third toe nail partially avulsed.  No purulent drainage or bleeding.  Sensation intact, FROM, strength 5/5.  Neurological:     General: No focal deficit present.     Mental Status: She is alert.     Sensory: No sensory deficit.     Motor: No weakness.     Gait: Gait normal.      UC Treatments / Results  Labs (all labs ordered are listed, but only abnormal results are displayed) Labs Reviewed - No data to display  EKG   Radiology No results found.  Procedures Procedures (including critical care time)  Medications Ordered in UC Medications - No data to  display  Initial Impression / Assessment and Plan / UC Course  I have reviewed the triage vital signs and the nursing notes.  Pertinent labs & imaging results that were available during my care of the patient were reviewed by me and considered in my medical decision making (see chart for details).   Partial nail avulsion of left third toe.  Patient declines x-ray.  Treating today with doxycycline .  10 tablets of Norco prescribed also as patient reports her pain is uncontrolled with tramadol .  Instructed her not to take tramadol  with the Norco.  Precautions for drowsiness with Norco discussed.  Instructed patient to follow-up with a podiatrist.  Contact information for on-call podiatrist provided.  Education provided on nail avulsion.  She agrees to plan of care.  Final Clinical Impressions(s) / UC Diagnoses   Final diagnoses:  Nail avulsion of toe, initial encounter     Discharge Instructions      Follow-up with a podiatrist such as the one listed below.    Take the doxycycline  as directed.    Take the Norco as directed.  Do not drive, operate machinery, drink alcohol, or perform dangerous activities while taking this medication as it may cause drowsiness.  Do not take this medication with your tramadol .     ED Prescriptions     Medication Sig Dispense Auth. Provider   doxycycline  (VIBRAMYCIN ) 100 MG capsule Take 1 capsule (100 mg total) by mouth 2 (two) times daily for 7 days. 14 capsule Corlis Burnard DEL, NP   HYDROcodone -acetaminophen  (NORCO/VICODIN) 5-325 MG tablet Take 2 tablets by mouth every 4 (four) hours as needed. 10 tablet Corlis Burnard DEL, NP      I have reviewed the PDMP during this encounter.   Corlis Burnard DEL, NP 08/05/23 1245

## 2023-08-05 NOTE — Discharge Instructions (Addendum)
 Follow-up with a podiatrist such as the one listed below.    Take the doxycycline  as directed.    Take the Norco as directed.  Do not drive, operate machinery, drink alcohol, or perform dangerous activities while taking this medication as it may cause drowsiness.  Do not take this medication with your tramadol .

## 2023-08-09 ENCOUNTER — Ambulatory Visit
Admission: RE | Admit: 2023-08-09 | Discharge: 2023-08-09 | Disposition: A | Discharge status: Home / Self Care | Source: Ambulatory Visit | Attending: Neurosurgery | Admitting: Neurosurgery

## 2023-08-09 DIAGNOSIS — M5010 Cervical disc disorder with radiculopathy, unspecified cervical region: Secondary | ICD-10-CM | POA: Diagnosis present

## 2023-08-09 DIAGNOSIS — Z981 Arthrodesis status: Secondary | ICD-10-CM | POA: Diagnosis present

## 2023-08-17 ENCOUNTER — Other Ambulatory Visit: Payer: Self-pay | Admitting: Neurosurgery

## 2023-08-17 DIAGNOSIS — M47812 Spondylosis without myelopathy or radiculopathy, cervical region: Secondary | ICD-10-CM

## 2023-08-23 NOTE — Progress Notes (Unsigned)
 Progress Note: Referring Physician:  Auston Reyes BIRCH, MD [address] [facility] [place],  [place] [address]  Primary Physician:  Regina Reyes BIRCH, MD  Chief Complaint:  f/u to review MRI and xray results   History of Present Illness: Regina Brown is a 68 y.o. female who presents today to review her recent imaging.  We briefly discussed her MRI results via MyChart but I recommended she return to clinic tor review them in person as well as discuss next steps for plan of care. She requested a referral to Dr. Myra for injection as she has seen him for this in the past and had a good response. I placed a referral on 7/24.  Today ***  LOV 08/03/23 [name] is a 68 y.o with a history of HTN, HLD, hypothyroidism who is here today with a chief complaint of left sided neck and shoulder blade pain that has been intermittent of the last 2 years. She notes similar symptoms in 2010 with associated left arm pain and weakness at that time (which are not present now). This resulted in a C6-7 ACDF by Dr. Kathi which improved her symptoms.    Conservative measures:  Physical therapy:   has not participated in PT Multimodal medical therapy including regular antiinflammatories: Tramadol , Tizanidine , Naproxen   Injections:  no epidural steroid injections   Past Surgery:  03/2008-Ruptured disc surgery with Dr Regina Brown C6-C7   Regina Brown has no symptoms of cervical myelopathy.   The symptoms are causing a significant impact on the patient's life.   Exam: There were no vitals filed for this visit. There is no height or weight on file to calculate BMI.    NEUROLOGICAL:  General: In no acute distress.   Awake, alert, oriented to person, place, and time.  Pupils equal round and reactive to light.  Facial tone is symmetric.   ROM of spine: ***full.  Palpation of spine: ***nontender.    Strength: Side Biceps Triceps Deltoid Interossei Grip Wrist Ext. Wrist Flex.  R 5  5 5 5 5 5 5   L 5 5 5 5 5 5 5      Imaging: 08/03/23 cervical xrays FINDINGS: Postoperative change at the C6-C7 interspace. Adjacent segment disease with advanced disc space height loss and degenerative endplate changes at C5-C6. Multilevel moderate facet arthropathy. Normal prevertebral soft tissues.   On neutral view there is 2 mm of anterolisthesis of C4. This increases to 3 mm with flexion. Additional 3 mm of anterolisthesis of C3 with flexion. No listhesis on extension.   IMPRESSION: 1. Adjacent segment disease at C5-C6 where it is advanced. 2. Flexion causes 3 mm of anterolisthesis of C3. No listhesis with extension. 3. Anterolisthesis of C4 increases from 2 mm to 3 mm with flexion. This resolves with extension.     Electronically Signed   By: Regina Brown M.D.   On: 08/05/2023 21:23  MRI C spine 08/09/23 FINDINGS: Alignment: Straightening of the normal cervical lordosis with slight degenerative anterolisthesis at C4-5.   Vertebrae: No fracture or osseous lesion. Status post interbody fusion at C6-7. Moderate chronic degenerative disc disease at C5-6 with discogenic reactive changes within the vertebral endplates. Moderate left-sided facet arthrosis at C4-5 with reactive bone marrow edema.   Cord: Normal in morphology and signal intensity.   Posterior Fossa, vertebral arteries, paraspinal tissues: Normal vertebral artery flow voids. No paraspinous soft tissue lesions or inflammatory change evident.   Disc levels:   Mild degenerative anterolisthesis at  C4-5 with bilateral facet arthrosis, worse on the left. There is mild central spinal canal stenosis and moderate to severe left neural foraminal stenosis. The right neural foramen is patent. There is chronic degenerative disc disease at C5-6 with bilateral uncovertebral joint hypertrophy. There is mild central spinal canal stenosis and moderate right neural foraminal stenosis. The patient is status post  interbody fusion at C6-7. The spinal canal and neural foramina are widely patent at the operative level.   IMPRESSION: 1. Degenerative anterolisthesis at C4-5 with bilateral facet arthrosis and edematous changes within the left facet joints. 2. Chronic degenerative disc disease at C5-6 with mild central spinal canal stenosis and moderate right neural foraminal stenosis.     Electronically Signed   By: Regina Brown M.D.   On: 08/11/2023 12:34  I have personally reviewed the images and agree with the above interpretation.  Assessment and Plan: [name] is a pleasant 68 y.o. female with cervical radiculopathy. We discussed ***  I spent a total of *** minutes in both face-to-face and non-face-to-face activities for this visit on the date of this encounter including review of imaging, discussion of diagnosis, discussion of treatment options, and documentation.   Regina Goods PA-C Neurosurgery

## 2023-08-24 ENCOUNTER — Ambulatory Visit: Admitting: Neurosurgery

## 2023-08-24 ENCOUNTER — Encounter: Payer: Self-pay | Admitting: Neurosurgery

## 2023-08-24 ENCOUNTER — Other Ambulatory Visit: Payer: Self-pay

## 2023-08-24 VITALS — BP 122/64 | Ht 64.0 in | Wt 155.0 lb

## 2023-08-24 DIAGNOSIS — M47812 Spondylosis without myelopathy or radiculopathy, cervical region: Secondary | ICD-10-CM

## 2023-08-24 DIAGNOSIS — M5412 Radiculopathy, cervical region: Secondary | ICD-10-CM | POA: Diagnosis not present

## 2023-08-24 DIAGNOSIS — M542 Cervicalgia: Secondary | ICD-10-CM

## 2023-08-24 DIAGNOSIS — M5010 Cervical disc disorder with radiculopathy, unspecified cervical region: Secondary | ICD-10-CM

## 2023-08-28 ENCOUNTER — Ambulatory Visit: Attending: Anesthesiology | Admitting: Anesthesiology

## 2023-08-28 ENCOUNTER — Encounter: Payer: Self-pay | Admitting: Anesthesiology

## 2023-08-28 VITALS — BP 172/73

## 2023-08-28 DIAGNOSIS — M542 Cervicalgia: Secondary | ICD-10-CM | POA: Insufficient documentation

## 2023-08-28 DIAGNOSIS — M961 Postlaminectomy syndrome, not elsewhere classified: Secondary | ICD-10-CM | POA: Insufficient documentation

## 2023-08-28 DIAGNOSIS — M797 Fibromyalgia: Secondary | ICD-10-CM | POA: Diagnosis not present

## 2023-08-28 DIAGNOSIS — M5412 Radiculopathy, cervical region: Secondary | ICD-10-CM | POA: Diagnosis not present

## 2023-08-28 DIAGNOSIS — M1711 Unilateral primary osteoarthritis, right knee: Secondary | ICD-10-CM | POA: Diagnosis present

## 2023-08-28 NOTE — Progress Notes (Unsigned)
 Safety precautions to be maintained throughout the outpatient stay will include: orient to surroundings, keep bed in low position, maintain call bell within reach at all times, provide assistance with transfer out of bed and ambulation.

## 2023-08-28 NOTE — Progress Notes (Unsigned)
 Subjective:  Patient ID: Regina Brown, female    DOB: 01-24-1956  Age: 68 y.o. MRN: [id number]  CC: Neck Pain (Left side )     PROCEDURE: None  HPI Regina Brown presents for evaluation.  Regina Brown is a patient that I have known for many years but have not seen for several years here in the clinic.  Regina Brown has a longstanding Regina of osteoarthritis and fibromyalgia.  Approximately 10 to 12 years ago Regina Brown had a cervical epidural steroid injection to help alleviate some neck pain and ultimately did require an anterior cervical disc fusion per Dr. Waddell.  This is back in 2010.  Regina Brown had a ruptured disc between C6 and C7 and this did alleviate a significant portion of her pain.  At present Regina Brown has had a recurrence of the gradual crescendo type nature affecting the left posterior lateral neck radiating into the left rhomboid area at left trapezius and left lateral deltoid but not into the hands.  Regina Brown describes this is a maximum pain score of a 7 down to a minimum of 2 or 3 at best that averages about a 4 5.  This is throughout the day.  It is worse with activity both before exercise and with worsening postexercise.  Twisting bending working lifting seem to aggravate the pain.  It is alleviated by rest sleeping on an therapeutic neck pillow and massage.  Regina Brown is scheduled to undergo physical therapy here soon.  Regina Brown was recently seen by Dr. Katrina for evaluation and he has recommended cervical epidural steroid injection in addition to physical therapy.  The pain is described as an aching burning constant cramping deep seeded pain affecting the left lateral neck.  It is occasionally lancinating and this is transient in nature with electricity like sensation occasionally running into the hand.  No problems of bowel or bladder function are noted at this time.  Regina Brown has a past medical Regina of Fibromyalgia, Hyperlipidemia, and Hypertension.   Regina Brown has a past surgical Regina that includes  Augmentation mammaplasty; Cervical disc surgery (03/2008); and Laparoscopic total hysterectomy.   Her family Regina includes Breast cancer in her maternal aunt.Regina Brown reports that Regina Brown has quit smoking. Her smoking use included cigarettes. Regina Brown has never used smokeless tobacco. Regina Brown reports current alcohol use. Regina Brown reports that Regina Brown does not use drugs.  No results found for this or any previous visit.   No results found for: TOXASSSELUR  Outpatient Medications Prior to Visit  Medication Sig Dispense Refill   amitriptyline  (ELAVIL ) 10 MG tablet Take 1-2 tablets (10-20 mg total) by mouth at bedtime. 180 tablet 3   estradiol  (ESTRACE ) 2 MG tablet Take 1 tablet (2 mg total) by mouth daily. 90 tablet 4   fluticasone  (FLONASE ) 50 MCG/ACT nasal spray Place 2 sprays into both nostrils daily. 16 g 6   levothyroxine  (SYNTHROID ) 100 MCG tablet Take 1 tablet (100 mcg total) by mouth every morning. 90 tablet 3   levothyroxine  (SYNTHROID ) 100 MCG tablet Take 100 mcg by mouth daily before breakfast.     losartan  (COZAAR ) 50 MG tablet Take 1 tablet (50 mg total) by mouth daily. 90 tablet 3   metoprolol  succinate (TOPROL -XL) 50 MG 24 hr tablet Take 1 tablet (50 mg total) by mouth daily. 90 tablet 3   rosuvastatin  (CRESTOR ) 10 MG tablet Take 1 tablet (10 mg total) by mouth every evening. 90 tablet 3   tiZANidine  (ZANAFLEX ) 4 MG tablet Take 1 tablet (4 mg total) by mouth  at bedtime as needed for neck strain. 90 tablet 1   traMADol  (ULTRAM ) 50 MG tablet Take 1 tablet (50 mg total) by mouth every 6 (six) to 8 (eight) hours as needed. 90 tablet 4   venlafaxine  XR (EFFEXOR -XR) 150 MG 24 hr capsule Take 1 capsule (150 mg total) by mouth daily. 90 capsule 3   zolpidem  (AMBIEN ) 10 MG tablet Take 1 tablet (10 mg total) by mouth at bedtime. 90 tablet 2   levothyroxine  (SYNTHROID ) 100 MCG tablet Take 1 tablet (100 mcg total) by mouth daily. 90 tablet 1   traMADol  (ULTRAM ) 50 MG tablet Take 1 tablet (50 mg total) by mouth  every 6-8 (six) hours. 100 tablet 3   No facility-administered medications prior to visit.   Lab Results  Component Value Date   GLUCOSE 88 02/22/2011   NA 145 02/22/2011   K 3.7 02/22/2011   CL 107 02/22/2011   CREATININE 0.72 02/22/2011   BUN 12 02/22/2011   CO2 30 02/22/2011    --------------------------------------------------------------------------------------------------------------------- MR CERVICAL SPINE WO CONTRAST Result Date: 08/11/2023 CLINICAL DATA:  Cervical radiculopathy, prior cervical surgery EXAM: MRI CERVICAL SPINE WITHOUT CONTRAST TECHNIQUE: Multiplanar, multisequence MR imaging of the cervical spine was performed. No intravenous contrast was administered. COMPARISON:  Cervical spine series dated August 03, 2023 and MRI of the cervical spine dated March 20, 2008. FINDINGS: Alignment: Straightening of the normal cervical lordosis with slight degenerative anterolisthesis at C4-5. Vertebrae: No fracture or osseous lesion. Status post interbody fusion at C6-7. Moderate chronic degenerative disc disease at C5-6 with discogenic reactive changes within the vertebral endplates. Moderate left-sided facet arthrosis at C4-5 with reactive bone marrow edema. Cord: Normal in morphology and signal intensity. Posterior Fossa, vertebral arteries, paraspinal tissues: Normal vertebral artery flow voids. No paraspinous soft tissue lesions or inflammatory change evident. Disc levels: Mild degenerative anterolisthesis at C4-5 with bilateral facet arthrosis, worse on the left. There is mild central spinal canal stenosis and moderate to severe left neural foraminal stenosis. The right neural foramen is patent. There is chronic degenerative disc disease at C5-6 with bilateral uncovertebral joint hypertrophy. There is mild central spinal canal stenosis and moderate right neural foraminal stenosis. The patient is status post interbody fusion at C6-7. The spinal canal and neural foramina are widely  patent at the operative level. IMPRESSION: 1. Degenerative anterolisthesis at C4-5 with bilateral facet arthrosis and edematous changes within the left facet joints. 2. Chronic degenerative disc disease at C5-6 with mild central spinal canal stenosis and moderate right neural foraminal stenosis. Electronically Signed   By: Evalene Coho M.D.   On: 08/11/2023 12:34       ---------------------------------------------------------------------------------------------------------------------- Past Medical Regina:  Diagnosis Date   Fibromyalgia    Hyperlipidemia    Hypertension     Past Surgical Regina:  Procedure Laterality Date   AUGMENTATION MAMMAPLASTY     left side only   CERVICAL DISC SURGERY  03/2008   Dr Kathi   LAPAROSCOPIC TOTAL HYSTERECTOMY     2010    Family Regina  Problem Relation Age of Onset   Breast cancer Maternal Aunt     Social Regina   Tobacco Use   Smoking status: Former    Types: Cigarettes   Smokeless tobacco: Never  Substance Use Topics   Alcohol use: Yes    Comment: rarely    ---------------------------------------------------------------------------------------------------------------------  Scheduled Meds: Continuous Infusions: PRN Meds:.   BP (!) 172/73 Comment: Dr Myra notified; pt has not taken BP med today; normally takes  at night; instructed pt to monitor BP and contact PCP if continues to be elevated; pt verb u/o   BP Readings from Last 3 Encounters:  08/28/23 (!) 172/73  08/24/23 122/64  08/05/23 137/69     Wt Readings from Last 3 Encounters:  08/24/23 155 lb (70.3 kg)  08/03/23 155 lb 4 oz (70.4 kg)     ----------------------------------------------------------------------------------------------------------------------  ROS Review of Systems Regina Brown denies headaches photophobia Cardiac: No angina or palpitations Pulmonary: No shortness of breath or dyspnea or wheezing GI: No obstipation constipation or abdominal  pain Musculoskeletal: As above with primarily left side cervical neck pain radiating into the left rhomboid and left trapezius  Objective:  BP (!) 172/73 Comment: Dr Myra notified; pt has not taken BP med today; normally takes at night; instructed pt to monitor BP and contact PCP if continues to be elevated; pt verb u/o  Physical Exam Patient is alert oriented cooperative compliant and a good historian. HEENT exam is pupils equally round reactive to light and extraocular muscle intact Heart is regular rate and rhythm without murmur or rub Lungs are clear to auscultation Musculoskeletal: Regina Brown has some mild pain with atlantooccipital range of motion with left lateral and right lateral rotation at the atlantooccipital joint.  Flexion extension is and is limited with some mild pain associated.  Regina Brown has some tenderness in the left splenius capitis and lef trapezius but no overt trigger points.  Regina Brown is mildly tender in the left rhomboid.  Regina Brown has good range of motion at the glenohumeral joint.  Regina Brown has excellent strength of 5/5 both proximal distal to the bicep tricep hand grasp and wrist.  Sensory is intact.   Assessment & Plan:   Regina Brown was seen today for neck pain.  Diagnoses and all orders for this visit:  Fibromyalgia  Primary osteoarthritis of right knee  Cervicalgia -     Cervical Epidural Injection; Future  Cervical radiculitis -     Cervical Epidural Injection; Future  Cervical post-laminectomy syndrome     ----------------------------------------------------------------------------------------------------------------------  Problem List Items Addressed This Visit       Unprioritized   Cervical post-laminectomy syndrome   Cervical radiculitis   Relevant Orders   Cervical Epidural Injection   Cervicalgia   Relevant Orders   Cervical Epidural Injection   Fibromyalgia - Primary   Other Visit Diagnoses       Primary osteoarthritis of right knee            ----------------------------------------------------------------------------------------------------------------------  1. Fibromyalgia (Primary) Continue with core stretching strengthening exercises and current medication management in addition to her muscle relaxant and tramadol .  2. Primary osteoarthritis of right knee   3. Cervicalgia I do think Regina Brown would be a reasonable candidate for cervical epidural secondary to the radicular component of her pain and its ongoing nature which has been crescendo.  We talked about the risks and benefits of the procedure with her in full detail all questions were answered.  Will schedule this for the next few weeks. - Cervical Epidural Injection; Future  4. Cervical radiculitis As above - Cervical Epidural Injection; Future  5. Cervical post-laminectomy syndrome I agree with physical therapy evaluation and ongoing core stretching strengthening exercises.    ----------------------------------------------------------------------------------------------------------------------  I am having Regina Brown maintain her fluticasone , traMADol , estradiol , levothyroxine , metoprolol  succinate, amitriptyline , zolpidem , traMADol , levothyroxine , losartan , rosuvastatin , tiZANidine , venlafaxine  XR, and levothyroxine .   No orders of the defined types were placed in this encounter.      Follow-up: Return in about  2 weeks (around 09/11/2023) for evaluation, procedure.    Regina KANDICE Clause, MD 3:39 PM  The Regina Brown practitioner database for opioid medications on this patient has been reviewed by me and my staff   Greater than 50% of the total encounter time was spent in counseling and / or coordination of care.     This dictation was performed utilizing Conservation officer, historic buildings.  Please excuse any unintentional or mistaken typographical errors as a result.

## 2023-08-28 NOTE — Patient Instructions (Signed)

## 2023-09-04 ENCOUNTER — Ambulatory Visit
Admission: RE | Admit: 2023-09-04 | Discharge: 2023-09-04 | Disposition: A | Discharge status: Home / Self Care | Source: Ambulatory Visit | Attending: Anesthesiology | Admitting: Anesthesiology

## 2023-09-04 ENCOUNTER — Ambulatory Visit: Admitting: Anesthesiology

## 2023-09-04 ENCOUNTER — Encounter: Payer: Self-pay | Admitting: Anesthesiology

## 2023-09-04 ENCOUNTER — Other Ambulatory Visit: Payer: Self-pay | Admitting: Anesthesiology

## 2023-09-04 VITALS — BP 110/49 | HR 88 | Resp 18 | Ht 64.0 in | Wt 153.0 lb

## 2023-09-04 DIAGNOSIS — M542 Cervicalgia: Secondary | ICD-10-CM

## 2023-09-04 DIAGNOSIS — M961 Postlaminectomy syndrome, not elsewhere classified: Secondary | ICD-10-CM | POA: Insufficient documentation

## 2023-09-04 DIAGNOSIS — M5412 Radiculopathy, cervical region: Secondary | ICD-10-CM

## 2023-09-04 DIAGNOSIS — R52 Pain, unspecified: Secondary | ICD-10-CM

## 2023-09-04 MED ORDER — ROPIVACAINE HCL 2 MG/ML IJ SOLN
10.0000 mL | Freq: Once | INTRAMUSCULAR | Status: AC
  Administered 2023-09-04 (×2): 2 mL via EPIDURAL
  Filled 2023-09-04: qty 20

## 2023-09-04 MED ORDER — IOHEXOL 180 MG/ML  SOLN
10.0000 mL | Freq: Once | INTRAMUSCULAR | Status: AC | PRN
  Administered 2023-09-04 (×2): 10 mL via EPIDURAL
  Filled 2023-09-04: qty 20

## 2023-09-04 MED ORDER — MIDAZOLAM HCL 2 MG/2ML IJ SOLN
2.0000 mg | Freq: Once | INTRAMUSCULAR | Status: AC
  Administered 2023-09-04 (×2): 2 mg via INTRAVENOUS
  Filled 2023-09-04: qty 2

## 2023-09-04 MED ORDER — DEXAMETHASONE SODIUM PHOSPHATE 10 MG/ML IJ SOLN
10.0000 mg | Freq: Once | INTRAMUSCULAR | Status: AC
  Administered 2023-09-04 (×2): 10 mg
  Filled 2023-09-04: qty 1

## 2023-09-04 MED ORDER — SODIUM CHLORIDE 0.9% FLUSH
10.0000 mL | Freq: Once | INTRAVENOUS | Status: AC
  Administered 2023-09-04 (×2): 10 mL

## 2023-09-04 MED ORDER — LACTATED RINGERS IV SOLN: INTRAVENOUS | Status: DC

## 2023-09-04 MED ORDER — LIDOCAINE HCL (PF) 1 % IJ SOLN
5.0000 mL | Freq: Once | INTRAMUSCULAR | Status: AC
  Administered 2023-09-04 (×2): 5 mL via SUBCUTANEOUS
  Filled 2023-09-04: qty 5

## 2023-09-04 NOTE — Patient Instructions (Signed)

## 2023-09-04 NOTE — Progress Notes (Signed)
 Subjective:  Patient ID: Regina Brown, female    DOB: [birth year]  Age: 68 y.o. MRN: [id number]  CC: Neck Pain   Procedure: C7-T1 cervical epidural steroid under fluoroscopic guidance with minimal sedation   HPI Regina Brown presents for reevaluation.  Regina Brown was seen just a few weeks ago and presents today for cervical epidural.  The quality characteristic and distribution of her symptoms continue as before with left greater than right side cervical radiculitis.  Outpatient Medications Prior to Visit  Medication Sig Dispense Refill   amitriptyline  (ELAVIL ) 10 MG tablet Take 1-2 tablets (10-20 mg total) by mouth at bedtime. 180 tablet 3   estradiol  (ESTRACE ) 2 MG tablet Take 1 tablet (2 mg total) by mouth daily. 90 tablet 4   Brown  (FLONASE ) 50 MCG/ACT nasal spray Place 2 sprays into both nostrils daily. 16 g 6   levothyroxine  (SYNTHROID ) 100 MCG tablet Take 1 tablet (100 mcg total) by mouth daily. (Patient not taking: Reported on 09/04/2023) 90 tablet 1   levothyroxine  (SYNTHROID ) 100 MCG tablet Take 1 tablet (100 mcg total) by mouth every morning. (Patient not taking: Reported on 09/04/2023) 90 tablet 3   levothyroxine  (SYNTHROID ) 100 MCG tablet Take 100 mcg by mouth daily before breakfast.     losartan  (COZAAR ) 50 MG tablet Take 1 tablet (50 mg total) by mouth daily. 90 tablet 3   metoprolol  succinate (TOPROL -XL) 50 MG 24 hr tablet Take 1 tablet (50 mg total) by mouth daily. 90 tablet 3   rosuvastatin  (CRESTOR ) 10 MG tablet Take 1 tablet (10 mg total) by mouth every evening. 90 tablet 3   tiZANidine  (ZANAFLEX ) 4 MG tablet Take 1 tablet (4 mg total) by mouth at bedtime as needed for neck strain. 90 tablet 1   traMADol  (ULTRAM ) 50 MG tablet Take 1 tablet (50 mg total) by mouth every 6-8 (six) hours. (Patient not taking: Reported on 09/04/2023) 100 tablet 3   traMADol  (ULTRAM ) 50 MG tablet Take 1 tablet (50 mg total) by mouth every 6 (six) to 8 (eight) hours as needed. 90 tablet 4    venlafaxine  XR (EFFEXOR -XR) 150 MG 24 hr capsule Take 1 capsule (150 mg total) by mouth daily. 90 capsule 3   zolpidem  (AMBIEN ) 10 MG tablet Take 1 tablet (10 mg total) by mouth at bedtime. 90 tablet 2   No facility-administered medications prior to visit.    Review of Systems CNS: No confusion or sedation Cardiac: No angina or palpitations GI: No abdominal pain or constipation Constitutional: No nausea vomiting fevers or chills  Objective:  BP (!) 110/49   Pulse 88   Resp 18   Ht 5' 4 (1.626 m)   Wt 153 lb (69.4 kg)   SpO2 97%   BMI 26.26 kg/m    BP Readings from Last 3 Encounters:  09/04/23 (!) 110/49  08/28/23 (!) 172/73  08/24/23 122/64     Wt Readings from Last 3 Encounters:  09/04/23 153 lb (69.4 kg)  08/24/23 155 lb (70.3 kg)  08/03/23 155 lb 4 oz (70.4 kg)     Physical Exam Pt is alert and oriented PERRL EOMI HEART IS RRR no murmur or rub LCTA no wheezing or rales MUSCULOSKELETAL strength testing is at baseline  Labs  No results found for: HGBA1C Lab Results  Component Value Date   CREATININE 0.72 02/22/2011    -------------------------------------------------------------------------------------------------------------------- Lab Results  Component Value Date   GLUCOSE 88 02/22/2011   NA 145 02/22/2011   K 3.7 02/22/2011  CL 107 02/22/2011   CREATININE 0.72 02/22/2011   BUN 12 02/22/2011   CO2 30 02/22/2011    --------------------------------------------------------------------------------------------------------------------- DG PAIN CLINIC C-ARM 1-60 MIN NO REPORT Result Date: 09/04/2023 Fluoro was used, but no Radiologist interpretation will be provided. Please refer to NOTES tab for provider progress note.    Assessment & Plan:   Regina Brown was seen today for neck pain.  Diagnoses and all orders for this visit:  Cervical post-laminectomy syndrome  Cervicalgia -     Cervical Epidural Injection -     sodium chloride  flush (NS)  0.9 % injection 10 mL -     ropivacaine  (PF) 2 mg/mL (0.2%) (NAROPIN ) injection 10 mL -     lidocaine  (PF) (XYLOCAINE ) 1 % injection 5 mL -     midazolam  (VERSED ) injection 2 mg -     lactated ringers  infusion -     iohexol  (OMNIPAQUE ) 180 MG/ML injection 10 mL -     dexamethasone  (DECADRON ) injection 10 mg  Cervical radiculitis -     Cervical Epidural Injection -     sodium chloride  flush (NS) 0.9 % injection 10 mL -     ropivacaine  (PF) 2 mg/mL (0.2%) (NAROPIN ) injection 10 mL -     lidocaine  (PF) (XYLOCAINE ) 1 % injection 5 mL -     midazolam  (VERSED ) injection 2 mg -     lactated ringers  infusion -     iohexol  (OMNIPAQUE ) 180 MG/ML injection 10 mL -     dexamethasone  (DECADRON ) injection 10 mg        ----------------------------------------------------------------------------------------------------------------------  Problem List Items Addressed This Visit       Unprioritized   Cervical post-laminectomy syndrome - Primary   Cervical radiculitis   Relevant Medications   lactated ringers  infusion   Cervicalgia   Relevant Medications   lactated ringers  infusion      ----------------------------------------------------------------------------------------------------------------------  1. Cervicalgia Will proceed with a cervical epidural today we will go over the risks and benefits of the procedure with her in full detail all questions were answered.  I want her to continue with core stretching exercises as reviewed continue with heat ice application and TENS application for her central cervical pain.  Continued on response we may schedule for repeat cervical epidural if indicated.  Follow-up in 1 month - Cervical Epidural Injection - sodium chloride  flush (NS) 0.9 % injection 10 mL - ropivacaine  (PF) 2 mg/mL (0.2%) (NAROPIN ) injection 10 mL - lidocaine  (PF) (XYLOCAINE ) 1 % injection 5 mL - midazolam  (VERSED ) injection 2 mg - lactated ringers  infusion - iohexol   (OMNIPAQUE ) 180 MG/ML injection 10 mL - dexamethasone  (DECADRON ) injection 10 mg  2. Cervical radiculitis As above - Cervical Epidural Injection - sodium chloride  flush (NS) 0.9 % injection 10 mL - ropivacaine  (PF) 2 mg/mL (0.2%) (NAROPIN ) injection 10 mL - lidocaine  (PF) (XYLOCAINE ) 1 % injection 5 mL - midazolam  (VERSED ) injection 2 mg - lactated ringers  infusion - iohexol  (OMNIPAQUE ) 180 MG/ML injection 10 mL - dexamethasone  (DECADRON ) injection 10 mg  3. Cervical post-laminectomy syndrome (Primary) As above    ----------------------------------------------------------------------------------------------------------------------  I am having Regina Brown , traMADol , estradiol , levothyroxine , metoprolol  succinate, amitriptyline , zolpidem , traMADol , levothyroxine , losartan , rosuvastatin , tiZANidine , venlafaxine  XR, and levothyroxine . We administered sodium chloride  flush, ropivacaine  (PF) 2 mg/mL (0.2%), lidocaine  (PF), midazolam , lactated ringers , iohexol , and dexamethasone .   Meds ordered this encounter  Medications   sodium chloride  flush (NS) 0.9 % injection 10 mL   ropivacaine  (PF) 2 mg/mL (0.2%) (NAROPIN )  injection 10 mL   lidocaine  (PF) (XYLOCAINE ) 1 % injection 5 mL   midazolam  (VERSED ) injection 2 mg   lactated ringers  infusion   iohexol  (OMNIPAQUE ) 180 MG/ML injection 10 mL   dexamethasone  (DECADRON ) injection 10 mg   Patient's Medications  New Prescriptions   No medications on file  Previous Medications   AMITRIPTYLINE  (ELAVIL ) 10 MG TABLET    Take 1-2 tablets (10-20 mg total) by mouth at bedtime.   ESTRADIOL  (ESTRACE ) 2 MG TABLET    Take 1 tablet (2 mg total) by mouth daily.   Brown  (FLONASE ) 50 MCG/ACT NASAL SPRAY    Place 2 sprays into both nostrils daily.   LEVOTHYROXINE  (SYNTHROID ) 100 MCG TABLET    Take 1 tablet (100 mcg total) by mouth daily.   LEVOTHYROXINE  (SYNTHROID ) 100 MCG TABLET    Take 1 tablet (100 mcg total) by  mouth every morning.   LEVOTHYROXINE  (SYNTHROID ) 100 MCG TABLET    Take 100 mcg by mouth daily before breakfast.   LOSARTAN  (COZAAR ) 50 MG TABLET    Take 1 tablet (50 mg total) by mouth daily.   METOPROLOL  SUCCINATE (TOPROL -XL) 50 MG 24 HR TABLET    Take 1 tablet (50 mg total) by mouth daily.   ROSUVASTATIN  (CRESTOR ) 10 MG TABLET    Take 1 tablet (10 mg total) by mouth every evening.   TIZANIDINE  (ZANAFLEX ) 4 MG TABLET    Take 1 tablet (4 mg total) by mouth at bedtime as needed for neck strain.   TRAMADOL  (ULTRAM ) 50 MG TABLET    Take 1 tablet (50 mg total) by mouth every 6-8 (six) hours.   TRAMADOL  (ULTRAM ) 50 MG TABLET    Take 1 tablet (50 mg total) by mouth every 6 (six) to 8 (eight) hours as needed.   VENLAFAXINE  XR (EFFEXOR -XR) 150 MG 24 HR CAPSULE    Take 1 capsule (150 mg total) by mouth daily.   ZOLPIDEM  (AMBIEN ) 10 MG TABLET    Take 1 tablet (10 mg total) by mouth at bedtime.  Modified Medications   No medications on file  Discontinued Medications   No medications on file   ----------------------------------------------------------------------------------------------------------------------  Follow-up: No follow-ups on file.  patient was taken to the fluoroscopy suite and placed in the prone position. IV sedation was with 2 mg Versed  yielding mild sedation for the procedure. The patient was responsive throughout and vital signs were stable throughout. The area overlying the cervical region was prepped with stereo prep 3 and then 1% lidocaine  2 cc was injected with a 25-gauge needle at the C7-T1 interspace. This was done with strict aseptic technique. I then advanced an 18-gauge Touhy needle using a hanging drop technique and AP fluoroscopic guidance. There was loss of resistance at approximately 5 cm no paresthesia and clear evidence of epidural placement confirmed with 2 cc of contrast dye yielding good epidural spread. Following this I injected a mixture comprised of 10 mg of Decadron   1 cc of normal saline and 1 cc of ropivacaine  0.2%. The needle was withdrawn and the patient tolerated the procedure without difficulty and was convalesced and discharged home for follow-up as mentioned.  Lynwood KANDICE Clause, MD

## 2023-09-04 NOTE — Progress Notes (Signed)
 Safety precautions to be maintained throughout the outpatient stay will include: orient to surroundings, keep bed in low position, maintain call bell within reach at all times, provide assistance with transfer out of bed and ambulation.

## 2023-09-05 ENCOUNTER — Encounter: Payer: Self-pay | Admitting: Anesthesiology

## 2023-09-05 ENCOUNTER — Telehealth: Payer: Self-pay | Admitting: *Deleted

## 2023-09-05 ENCOUNTER — Other Ambulatory Visit: Payer: Self-pay

## 2023-09-05 MED ORDER — CHLORHEXIDINE GLUCONATE 0.12 % MT SOLN
15.0000 mL | Freq: Two times a day (BID) | OROMUCOSAL | 12 refills | Status: AC
  Filled 2023-09-05: qty 473, 16d supply, fill #0
  Filled 2024-01-08: qty 473, 16d supply, fill #1
  Filled 2024-02-11: qty 473, 16d supply, fill #2

## 2023-09-05 MED ORDER — AZITHROMYCIN 250 MG PO TABS
ORAL_TABLET | ORAL | 0 refills | Status: AC
  Filled 2023-09-05: qty 6, 5d supply, fill #0

## 2023-09-05 NOTE — Telephone Encounter (Signed)
 Post procedure call; voicemail left

## 2023-09-06 ENCOUNTER — Other Ambulatory Visit: Payer: Self-pay

## 2023-09-06 MED ORDER — HYDROCODONE-ACETAMINOPHEN 5-325 MG PO TABS
1.0000 | ORAL_TABLET | Freq: Four times a day (QID) | ORAL | 0 refills | Status: AC
  Filled 2023-09-06: qty 6, 2d supply, fill #0

## 2023-09-08 ENCOUNTER — Encounter: Payer: Self-pay | Admitting: Internal Medicine

## 2023-09-14 ENCOUNTER — Ambulatory Visit: Admitting: Neurosurgery

## 2023-09-15 ENCOUNTER — Other Ambulatory Visit: Payer: Self-pay

## 2023-09-18 ENCOUNTER — Other Ambulatory Visit: Payer: Self-pay

## 2023-09-18 ENCOUNTER — Ambulatory Visit: Payer: Self-pay | Admitting: General Surgery

## 2023-09-19 ENCOUNTER — Ambulatory Visit: Payer: Self-pay | Admitting: Urgent Care

## 2023-09-19 ENCOUNTER — Encounter: Payer: Self-pay | Admitting: Internal Medicine

## 2023-09-19 ENCOUNTER — Ambulatory Visit: Admitting: Anesthesiology

## 2023-09-19 ENCOUNTER — Other Ambulatory Visit: Payer: Self-pay

## 2023-09-19 ENCOUNTER — Encounter
Admission: RE | Disposition: A | Discharge status: Home / Self Care | Payer: Self-pay | Source: Home / Self Care | Attending: Internal Medicine

## 2023-09-19 ENCOUNTER — Ambulatory Visit
Admission: RE | Admit: 2023-09-19 | Discharge: 2023-09-19 | Disposition: A | Discharge status: Home / Self Care | Source: Ambulatory Visit

## 2023-09-19 ENCOUNTER — Ambulatory Visit
Admission: RE | Admit: 2023-09-19 | Discharge: 2023-09-19 | Disposition: A | Discharge status: Home / Self Care | Attending: Internal Medicine | Admitting: Internal Medicine

## 2023-09-19 VITALS — Ht 64.0 in | Wt 153.0 lb

## 2023-09-19 DIAGNOSIS — Z87891 Personal history of nicotine dependence: Secondary | ICD-10-CM | POA: Diagnosis not present

## 2023-09-19 DIAGNOSIS — E039 Hypothyroidism, unspecified: Secondary | ICD-10-CM | POA: Insufficient documentation

## 2023-09-19 DIAGNOSIS — I10 Essential (primary) hypertension: Secondary | ICD-10-CM | POA: Diagnosis not present

## 2023-09-19 DIAGNOSIS — Z1211 Encounter for screening for malignant neoplasm of colon: Secondary | ICD-10-CM | POA: Insufficient documentation

## 2023-09-19 HISTORY — DX: Hypothyroidism, unspecified: E03.9

## 2023-09-19 HISTORY — PX: COLONOSCOPY: SHX5424

## 2023-09-19 HISTORY — DX: Contusion of left lesser toe(s) without damage to nail, initial encounter: S90.122A

## 2023-09-19 HISTORY — DX: Unilateral primary osteoarthritis, right knee: M17.11

## 2023-09-19 HISTORY — DX: Cervicalgia: M54.2

## 2023-09-19 SURGERY — COLONOSCOPY
Anesthesia: General

## 2023-09-19 MED ORDER — LIDOCAINE HCL (CARDIAC) PF 100 MG/5ML IV SOSY
PREFILLED_SYRINGE | INTRAVENOUS | Status: DC | PRN
  Administered 2023-09-19: 60 mg via INTRAVENOUS

## 2023-09-19 MED ORDER — PHENYLEPHRINE 80 MCG/ML (10ML) SYRINGE FOR IV PUSH (FOR BLOOD PRESSURE SUPPORT)
PREFILLED_SYRINGE | INTRAVENOUS | Status: AC
  Filled 2023-09-19: qty 10

## 2023-09-19 MED ORDER — LIDOCAINE HCL (PF) 2 % IJ SOLN
INTRAMUSCULAR | Status: AC
  Filled 2023-09-19: qty 5

## 2023-09-19 MED ORDER — PROPOFOL 10 MG/ML IV BOLUS
INTRAVENOUS | Status: AC
  Filled 2023-09-19: qty 40

## 2023-09-19 MED ORDER — PROPOFOL 10 MG/ML IV BOLUS
INTRAVENOUS | Status: DC | PRN
  Administered 2023-09-19: 80 mg via INTRAVENOUS
  Administered 2023-09-19: 130 ug/kg/min via INTRAVENOUS
  Administered 2023-09-19: 30 mg via INTRAVENOUS

## 2023-09-19 MED ORDER — SODIUM CHLORIDE 0.9 % IV SOLN: INTRAVENOUS | Status: DC

## 2023-09-19 NOTE — Anesthesia Preprocedure Evaluation (Addendum)
 Anesthesia Evaluation  Patient identified by MRN, date of birth, ID band Patient awake    Reviewed: Allergy & Precautions, H&P , NPO status , Patient's Chart, lab work & pertinent test results, reviewed documented beta blocker date and time   Airway Mallampati: II  TM Distance: >3 FB Neck ROM: full    Dental no notable dental hx.    Pulmonary former smoker   Pulmonary exam normal        Cardiovascular hypertension, Normal cardiovascular exam     Neuro/Psych negative neurological ROS  negative psych ROS   GI/Hepatic negative GI ROS, Neg liver ROS,,,  Endo/Other  Hypothyroidism    Renal/GU negative Renal ROS  negative genitourinary   Musculoskeletal   Abdominal   Peds  Hematology negative hematology ROS (+)   Anesthesia Other Findings Cervical post-laminectomy syndrome  Past Medical History: No date: Fibromyalgia No date: Hyperlipidemia No date: Hypertension  Past Surgical History: No date: ABDOMINAL HYSTERECTOMY No date: ACDF; N/A No date: AUGMENTATION MAMMAPLASTY     Comment:  left side only 03/2008: CERVICAL DISC SURGERY     Comment:  Dr Kathi No date: LAPAROSCOPIC TOTAL HYSTERECTOMY     Comment:  2010     Reproductive/Obstetrics negative OB ROS                              Anesthesia Physical Anesthesia Plan  ASA: 2  Anesthesia Plan: General   Post-op Pain Management: Minimal or no pain anticipated   Induction: Intravenous  PONV Risk Score and Plan: Propofol  infusion and TIVA  Airway Management Planned: Natural Airway  Additional Equipment:   Intra-op Plan:   Post-operative Plan:   Informed Consent: I have reviewed the patients History and Physical, chart, labs and discussed the procedure including the risks, benefits and alternatives for the proposed anesthesia with the patient or authorized representative who has indicated his/her understanding and  acceptance.     Dental Advisory Given  Plan Discussed with: CRNA and Surgeon  Anesthesia Plan Comments:          Anesthesia Quick Evaluation

## 2023-09-19 NOTE — Transfer of Care (Signed)
 Immediate Anesthesia Transfer of Care Note  Patient: Regina Brown  Procedure(s) Performed: COLONOSCOPY  Patient Location: PACU and Endoscopy Unit  Anesthesia Type:General  Level of Consciousness: awake, drowsy, and patient cooperative  Airway & Oxygen Therapy: Patient Spontanous Breathing and Patient connected to nasal cannula oxygen  Post-op Assessment: Report given to RN, Post -op Vital signs reviewed and stable, and Patient moving all extremities  Post vital signs: Reviewed and stable  Last Vitals:  Vitals Value Taken Time  BP 133/64 09/19/23 10:48  Temp 36.2 C 09/19/23 10:48  Pulse 85 09/19/23 10:48  Resp 21 09/19/23 10:48  SpO2 97 % 09/19/23 10:48    Last Pain:  Vitals:   09/19/23 1048  TempSrc: Temporal  PainSc: Asleep         Complications: No notable events documented.

## 2023-09-19 NOTE — H&P (Signed)
 Outpatient short stay form Pre-procedure 09/19/2023 10:25 AM Regina Brown, M.D.  Primary Physician: Reyes Costa, M.D.  Reason for visit:  Colon cancer screening  History of present illness:  Patient presents for colonoscopy for colon cancer screening. The patient denies complaints of abdominal pain, significant change in bowel habits, or rectal bleeding.      Current Facility-Administered Medications:    0.9 %  sodium chloride  infusion, , Intravenous, Continuous, [place], Sidonie Dexheimer K, MD, Last Rate: 20 mL/hr at 09/19/23 9062, New Bag at 09/19/23 9062  Medications Prior to Admission  Medication Sig Dispense Refill Last Dose/Taking   amitriptyline  (ELAVIL ) 10 MG tablet Take 1-2 tablets (10-20 mg total) by mouth at bedtime. 180 tablet 3 09/18/2023   chlorhexidine  (PERIDEX ) 0.12 % solution Rinse with 15 mLs and expectorate 2 (two) times daily. 473 mL 12 09/18/2023   estradiol  (ESTRACE ) 2 MG tablet Take 1 tablet (2 mg total) by mouth daily. 90 tablet 4 09/18/2023   levothyroxine  (SYNTHROID ) 100 MCG tablet Take 1 tablet (100 mcg total) by mouth every morning. 90 tablet 3 09/18/2023   losartan  (COZAAR ) 50 MG tablet Take 1 tablet (50 mg total) by mouth daily. 90 tablet 3 09/18/2023   metoprolol  succinate (TOPROL -XL) 50 MG 24 hr tablet Take 1 tablet (50 mg total) by mouth daily. 90 tablet 3 09/18/2023   rosuvastatin  (CRESTOR ) 10 MG tablet Take 1 tablet (10 mg total) by mouth every evening. 90 tablet 3 09/18/2023   tiZANidine  (ZANAFLEX ) 4 MG tablet Take 1 tablet (4 mg total) by mouth at bedtime as needed for neck strain. 90 tablet 1 09/18/2023   traMADol  (ULTRAM ) 50 MG tablet Take 1 tablet (50 mg total) by mouth every 6 (six) to 8 (eight) hours as needed. 90 tablet 4 09/18/2023   venlafaxine  XR (EFFEXOR -XR) 150 MG 24 hr capsule Take 1 capsule (150 mg total) by mouth daily. 90 capsule 3 09/18/2023   zolpidem  (AMBIEN ) 10 MG tablet Take 1 tablet (10 mg total) by mouth at bedtime. 90 tablet 2 09/18/2023    azithromycin  (ZITHROMAX ) 250 MG tablet AZITHROMYCIN  250 MG ORAL TABLET- BEGINNING AM OF PROCEDURE TAKE 2 TABLETS ON DAY 1 THEN TAKE 1 TABLET A DAY FOR 4 DAYS (Patient not taking: Reported on 09/15/2023) 6 tablet 0    fluticasone  (FLONASE ) 50 MCG/ACT nasal spray Place 2 sprays into both nostrils daily. (Patient taking differently: Place 2 sprays into both nostrils daily as needed for allergies.) 16 g 6    HYDROcodone -acetaminophen  (NORCO/VICODIN) 5-325 MG tablet Take 1 tablet by mouth every 6 (six) hours. (Patient not taking: Reported on 09/15/2023) 6 tablet 0    levothyroxine  (SYNTHROID ) 100 MCG tablet Take 1 tablet (100 mcg total) by mouth daily. (Patient not taking: Reported on 09/04/2023) 90 tablet 1    traMADol  (ULTRAM ) 50 MG tablet Take 1 tablet (50 mg total) by mouth every 6-8 (six) hours. (Patient not taking: Reported on 09/04/2023) 100 tablet 3      Allergies  Allergen Reactions   Augmentin [Amoxicillin-Pot Clavulanate] Nausea And Vomiting     Past Medical History:  Diagnosis Date   Fibromyalgia    Hyperlipidemia    Hypertension     Review of systems:  Otherwise negative.    Physical Exam  Gen: Alert, oriented. Appears stated age.  HEENT: [place]/AT. PERRLA. Lungs: CTA, no wheezes. CV: RR nl S1, S2. Abd: soft, benign, no masses. BS+ Ext: No edema. Pulses 2+    Planned procedures: Proceed with colonoscopy. The patient understands the nature  of the planned procedure, indications, risks, alternatives and potential complications including but not limited to bleeding, infection, perforation, damage to internal organs and possible oversedation/side effects from anesthesia. The patient agrees and gives consent to proceed.  Please refer to procedure notes for findings, recommendations and patient disposition/instructions.     Maleeka Sabatino K. Brown, M.D. Gastroenterology 09/19/2023  10:25 AM

## 2023-09-19 NOTE — Op Note (Signed)
 [facility] Gastroenterology Patient Name: Regina Brown Procedure Date: 09/19/2023 10:10 AM MRN: [id number] Account #: [id number] Date of Birth: [birth year] Admit Type: Outpatient Age: 68 Room: [facility] ENDO ROOM 1 Gender: Female Note Status: Finalized Instrument Name: Colon Scope [id number] Procedure:             Colonoscopy Indications:           Screening for colorectal malignant neoplasm Providers:             Nolton Denis K. Aundria MD, MD Referring MD:          Reyes BIRCH. Auston, MD (Referring MD) Medicines:             Propofol  per Anesthesia Complications:         No immediate complications. Procedure:             Pre-Anesthesia Assessment:                        - The risks and benefits of the procedure and the                         sedation options and risks were discussed with the                         patient. All questions were answered and informed                         consent was obtained.                        - Patient identification and proposed procedure were                         verified prior to the procedure by the nurse. The                         procedure was verified in the procedure room.                        - ASA Grade Assessment: III - A patient with severe                         systemic disease.                        - After reviewing the risks and benefits, the patient                         was deemed in satisfactory condition to undergo the                         procedure.                        After obtaining informed consent, the colonoscope was                         passed under direct vision. Throughout the procedure,  the patient's blood pressure, pulse, and oxygen                         saturations were monitored continuously. The                         Colonoscope was introduced through the anus and                         advanced to the the cecum, identified by appendiceal                          orifice and ileocecal valve. The colonoscopy was                         performed without difficulty. The patient tolerated                         the procedure well. The quality of the bowel                         preparation was good. The ileocecal valve, appendiceal                         orifice, and rectum were photographed. Findings:      The perianal and digital rectal examinations were normal. Pertinent       negatives include normal sphincter tone and no palpable rectal lesions.      The entire examined colon appeared normal on direct and retroflexion       views. Impression:            - The entire examined colon is normal on direct and                         retroflexion views.                        - No specimens collected. Recommendation:        - Patient has a contact number available for                         emergencies. The signs and symptoms of potential                         delayed complications were discussed with the patient.                         Return to normal activities tomorrow. Written                         discharge instructions were provided to the patient.                        - Resume previous diet.                        - Continue present medications.                        - Repeat colonoscopy  in 10 years for screening                         purposes.                        - Return to GI office PRN.                        - The findings and recommendations were discussed with                         the patient. Procedure Code(s):     --- Professional ---                        H9878, Colorectal cancer screening; colonoscopy on                         individual not meeting criteria for high risk Diagnosis Code(s):     --- Professional ---                        Z12.11, Encounter for screening for malignant neoplasm                         of colon CPT copyright 2022 [employer]. All rights reserved. The  codes documented in this report are preliminary and upon coder review may  be revised to meet current compliance requirements. Ladell MARLA Boss MD, MD 09/19/2023 10:49:08 AM This report has been signed electronically. Number of Addenda: 0 Note Initiated On: 09/19/2023 10:10 AM Scope Withdrawal Time: 0 hours 6 minutes 57 seconds  Total Procedure Duration: 0 hours 11 minutes 48 seconds  Estimated Blood Loss:  Estimated blood loss: none.      [facility]

## 2023-09-19 NOTE — Patient Instructions (Signed)
 Your procedure is scheduled on: Wednesday 09/27/23  Report to the Registration Desk on the 1st floor of the [facility]. To find out your arrival time, please call [phone] between 1PM - 3PM on: Tuesday 09/26/23 If your arrival time is 6:00 am, do not arrive before that time as the [facility] entrance doors do not open until 6:00 am.  REMEMBER: Instructions that are not followed completely may result in serious medical risk, up to and including death; or upon the discretion of your surgeon and anesthesiologist your surgery may need to be rescheduled.  Do not eat food after midnight the night before surgery.  No gum chewing or hard candies.  One week prior to surgery: Stop Anti-inflammatories (NSAIDS) such as Advil, Aleve , Ibuprofen, Motrin, Naproxen , Naprosyn  and Aspirin based products such as Excedrin, Goody's Powder, BC Powder. Stop ANY OVER THE COUNTER supplements until after surgery.  You may however, continue to take Tylenol  if needed for pain up until the day of surgery.  Continue taking all of your other prescription medications up until the day of surgery.  ON THE DAY OF SURGERY ONLY TAKE THESE MEDICATIONS WITH SIPS OF WATER:  estradiol  (ESTRACE ) 2 MG levothyroxine  (SYNTHROID ) 100 MCG  metoprolol  succinate (TOPROL -XL) 50 MG  venlafaxine  XR (EFFEXOR -XR) 150 MG    No Alcohol for 24 hours before or after surgery.  No Smoking including e-cigarettes for 24 hours before surgery.  No chewable tobacco products for at least 6 hours before surgery.  No nicotine patches on the day of surgery.  Do not use any recreational drugs for at least a week (preferably 2 weeks) before your surgery.  Please be advised that the combination of cocaine and anesthesia may have negative outcomes, up to and including death. If you test positive for cocaine, your surgery will be cancelled.  On the morning of surgery brush your teeth with toothpaste and water, you may rinse your mouth with  mouthwash if you wish. Do not swallow any toothpaste or mouthwash.  Use CHG Soap or wipes as directed on instruction sheet.  Do not wear jewelry, make-up, hairpins, clips or nail polish.  For welded (permanent) jewelry: bracelets, anklets, waist bands, etc.  Please have this removed prior to surgery.  If it is not removed, there is a chance that hospital personnel will need to cut it off on the day of surgery.  Do not wear lotions, powders, or perfumes.   Do not shave body hair from the neck down 48 hours before surgery.  Contact lenses, hearing aids and dentures may not be worn into surgery.  Do not bring valuables to the hospital. [facility] is not responsible for any missing/lost belongings or valuables.   Notify your doctor if there is any change in your medical condition (cold, fever, infection).  Wear comfortable clothing (specific to your surgery type) to the hospital.  When coughing or sneezing, hold a pillow firmly against your incision with both hands. This is called "splinting." Doing this helps protect your incision. It also decreases belly discomfort.  If you are being admitted to the hospital overnight, leave your suitcase in the car. After surgery it may be brought to your room.  In case of increased patient census, it may be necessary for you, the patient, to continue your postoperative care in the Same Day Surgery department.  If you are being discharged the day of surgery, you will not be allowed to drive home. You will need a responsible individual to drive you  home and stay with you for 24 hours after surgery.   If you are taking public transportation, you will need to have a responsible individual with you.  Please call the Pre-admissions Testing Dept. at [phone] if you have any questions about these instructions.  Surgery Visitation Policy:  Patients having surgery or a procedure may have two visitors.  Children under the age of 15 must have an adult  with them who is not the patient.  Inpatient Visitation:    Visiting hours are 7 a.m. to 8 p.m. Up to four visitors are allowed at one time in a patient room. The visitors may rotate out with other people during the day.  One visitor age 70 or older may stay with the patient overnight and must be in the room by 8 p.m.   Merchandiser, retail to address health-related social needs:  [email or web address]                                                                                                            Preparing for Surgery with CHLORHEXIDINE  GLUCONATE (CHG) Soap  Chlorhexidine  Gluconate (CHG) Soap  o An antiseptic cleaner that kills germs and bonds with the skin to continue killing germs even after washing  o Used for showering the night before surgery and morning of surgery  Before surgery, you can play an important role by reducing the number of germs on your skin.  CHG (Chlorhexidine  gluconate) soap is an antiseptic cleanser which kills germs and bonds with the skin to continue killing germs even after washing.  Please do not use if you have an allergy to CHG or antibacterial soaps. If your skin becomes reddened/irritated stop using the CHG.  1. Shower the NIGHT BEFORE SURGERY and the MORNING OF SURGERY with CHG soap.  2. If you choose to wash your hair, wash your hair first as usual with your normal shampoo.  3. After shampooing, rinse your hair and body thoroughly to remove the shampoo.  4. Use CHG as you would any other liquid soap. You can apply CHG directly to the skin and wash gently with a scrungie or a clean washcloth.  5. Apply the CHG soap to your body only from the neck down. Do not use on open wounds or open sores. Avoid contact with your eyes, ears, mouth, and genitals (private parts). Wash face and genitals (private parts) with your normal soap.  6. Wash thoroughly, paying special attention to the area where your surgery will be  performed.  7. Thoroughly rinse your body with warm water.  8. Do not shower/wash with your normal soap after using and rinsing off the CHG soap.  9. Pat yourself dry with a clean towel.  10. Wear clean pajamas to bed the night before surgery.  12. Place clean sheets on your bed the night of your first shower and do not sleep with pets.  13. Shower again with the CHG soap on the day of surgery prior to arriving at the hospital.  14. Do not apply any  deodorants/lotions/powders.  15. Please wear clean clothes to the hospital.

## 2023-09-19 NOTE — Interval H&P Note (Signed)
 History and Physical Interval Note:  09/19/2023 10:26 AM  Regina Brown  has presented today for surgery, with the diagnosis of Colon cancer screening (Z12.11).  The various methods of treatment have been discussed with the patient and family. After consideration of risks, benefits and other options for treatment, the patient has consented to  Procedure(s): COLONOSCOPY (N/A) as a surgical intervention.  The patient's history has been reviewed, patient examined, no change in status, stable for surgery.  I have reviewed the patient's chart and labs.  Questions were answered to the patient's satisfaction.     [place], Deara Bober

## 2023-09-19 NOTE — Anesthesia Postprocedure Evaluation (Signed)
 Anesthesia Post Note  Patient: Regina Brown  Procedure(s) Performed: COLONOSCOPY  Patient location during evaluation: PACU Anesthesia Type: General Level of consciousness: awake and alert Pain management: pain level controlled Vital Signs Assessment: post-procedure vital signs reviewed and stable Respiratory status: spontaneous breathing, nonlabored ventilation and respiratory function stable Cardiovascular status: blood pressure returned to baseline and stable Postop Assessment: no apparent nausea or vomiting Anesthetic complications: no   No notable events documented.   Last Vitals:  Vitals:   09/19/23 1058 09/19/23 1118  BP: 120/78 122/78  Pulse: 80 80  Resp:  20  Temp:    SpO2: 98% 98%    Last Pain:  Vitals:   09/19/23 1118  TempSrc:   PainSc: 0-No pain                 Camellia Merilee Louder

## 2023-09-21 ENCOUNTER — Other Ambulatory Visit: Payer: Self-pay

## 2023-09-21 ENCOUNTER — Encounter
Admission: RE | Admit: 2023-09-21 | Discharge: 2023-09-21 | Disposition: A | Discharge status: Home / Self Care | Source: Ambulatory Visit | Attending: General Surgery | Admitting: General Surgery

## 2023-09-21 DIAGNOSIS — Z01818 Encounter for other preprocedural examination: Secondary | ICD-10-CM | POA: Diagnosis present

## 2023-09-21 DIAGNOSIS — I10 Essential (primary) hypertension: Secondary | ICD-10-CM | POA: Insufficient documentation

## 2023-09-21 LAB — BASIC METABOLIC PANEL WITH GFR
Anion gap: 7 (ref 5–15)
BUN: 17 mg/dL (ref 8–23)
CO2: 24 mmol/L (ref 22–32)
Calcium: 9.3 mg/dL (ref 8.9–10.3)
Chloride: 107 mmol/L (ref 98–111)
Creatinine, Ser: 0.92 mg/dL (ref 0.44–1.00)
GFR, Estimated: >60 mL/min (ref >60)
Glucose, Bld: 94 mg/dL (ref 70–99)
Potassium: 4 mmol/L (ref 3.5–5.1)
Sodium: 138 mmol/L (ref 135–145)

## 2023-09-21 LAB — CBC
HCT: 39.6 % (ref 36.0–46.0)
Hemoglobin: 13.3 g/dL (ref 12.0–15.0)
MCH: 30.9 pg (ref 26.0–34.0)
MCHC: 33.6 g/dL (ref 30.0–36.0)
MCV: 91.9 fL (ref 80.0–100.0)
Platelets: 254 K/uL (ref 150–400)
RBC: 4.31 MIL/uL (ref 3.87–5.11)
RDW: 12.6 % (ref 11.5–15.5)
WBC: 7.9 K/uL (ref 4.0–10.5)
nRBC: 0 % (ref 0.0–0.2)

## 2023-09-27 ENCOUNTER — Other Ambulatory Visit: Payer: Self-pay

## 2023-09-27 ENCOUNTER — Encounter
Admission: RE | Disposition: A | Discharge status: Home / Self Care | Payer: Self-pay | Source: Ambulatory Visit | Attending: General Surgery

## 2023-09-27 ENCOUNTER — Ambulatory Visit: Payer: Self-pay | Admitting: Urgent Care

## 2023-09-27 ENCOUNTER — Ambulatory Visit: Payer: Self-pay | Admitting: Anesthesiology

## 2023-09-27 ENCOUNTER — Encounter: Payer: Self-pay | Admitting: General Surgery

## 2023-09-27 ENCOUNTER — Ambulatory Visit
Admission: RE | Admit: 2023-09-27 | Discharge: 2023-09-27 | Disposition: A | Discharge status: Home / Self Care | Source: Ambulatory Visit | Attending: General Surgery | Admitting: General Surgery

## 2023-09-27 DIAGNOSIS — K5900 Constipation, unspecified: Secondary | ICD-10-CM | POA: Insufficient documentation

## 2023-09-27 DIAGNOSIS — G709 Myoneural disorder, unspecified: Secondary | ICD-10-CM | POA: Insufficient documentation

## 2023-09-27 DIAGNOSIS — K8020 Calculus of gallbladder without cholecystitis without obstruction: Secondary | ICD-10-CM | POA: Diagnosis present

## 2023-09-27 DIAGNOSIS — Z8249 Family history of ischemic heart disease and other diseases of the circulatory system: Secondary | ICD-10-CM | POA: Insufficient documentation

## 2023-09-27 DIAGNOSIS — K8064 Calculus of gallbladder and bile duct with chronic cholecystitis without obstruction: Secondary | ICD-10-CM | POA: Insufficient documentation

## 2023-09-27 DIAGNOSIS — Z87891 Personal history of nicotine dependence: Secondary | ICD-10-CM | POA: Insufficient documentation

## 2023-09-27 DIAGNOSIS — K760 Fatty (change of) liver, not elsewhere classified: Secondary | ICD-10-CM | POA: Diagnosis not present

## 2023-09-27 DIAGNOSIS — I10 Essential (primary) hypertension: Secondary | ICD-10-CM | POA: Insufficient documentation

## 2023-09-27 DIAGNOSIS — E039 Hypothyroidism, unspecified: Secondary | ICD-10-CM | POA: Insufficient documentation

## 2023-09-27 SURGERY — CHOLECYSTECTOMY, ROBOT-ASSISTED, LAPAROSCOPIC
Anesthesia: General | Site: Abdomen

## 2023-09-27 MED ORDER — HYDROCODONE-ACETAMINOPHEN 5-325 MG PO TABS
1.0000 | ORAL_TABLET | Freq: Four times a day (QID) | ORAL | 0 refills | Status: AC | PRN
  Filled 2023-09-27: qty 12, 3d supply, fill #0

## 2023-09-27 MED ORDER — PROPOFOL 10 MG/ML IV BOLUS
INTRAVENOUS | Status: AC
  Filled 2023-09-27: qty 20

## 2023-09-27 MED ORDER — INDOCYANINE GREEN 25 MG IV SOLR
1.2500 mg | Freq: Once | INTRAVENOUS | Status: AC
  Administered 2023-09-27: 1.25 mg via INTRAVENOUS

## 2023-09-27 MED ORDER — ACETAMINOPHEN 10 MG/ML IV SOLN
INTRAVENOUS | Status: AC
  Filled 2023-09-27: qty 100

## 2023-09-27 MED ORDER — FENTANYL CITRATE (PF) 100 MCG/2ML IJ SOLN
25.0000 ug | INTRAMUSCULAR | Status: DC | PRN
  Administered 2023-09-27 (×2): 50 ug via INTRAVENOUS

## 2023-09-27 MED ORDER — CHLORHEXIDINE GLUCONATE 0.12 % MT SOLN
15.0000 mL | Freq: Once | OROMUCOSAL | Status: AC
  Administered 2023-09-27: 15 mL via OROMUCOSAL

## 2023-09-27 MED ORDER — OXYCODONE HCL 5 MG PO TABS
5.0000 mg | ORAL_TABLET | Freq: Once | ORAL | Status: AC | PRN
  Administered 2023-09-27: 5 mg via ORAL

## 2023-09-27 MED ORDER — ONDANSETRON HCL 4 MG/2ML IJ SOLN
INTRAMUSCULAR | Status: DC | PRN
  Administered 2023-09-27 (×2): 4 mg via INTRAVENOUS

## 2023-09-27 MED ORDER — OXYCODONE HCL 5 MG PO TABS
ORAL_TABLET | ORAL | Status: AC
  Filled 2023-09-27: qty 1

## 2023-09-27 MED ORDER — DEXAMETHASONE SODIUM PHOSPHATE 10 MG/ML IJ SOLN
INTRAMUSCULAR | Status: DC | PRN
  Administered 2023-09-27: 10 mg via INTRAVENOUS

## 2023-09-27 MED ORDER — SUCCINYLCHOLINE CHLORIDE 200 MG/10ML IV SOSY
PREFILLED_SYRINGE | INTRAVENOUS | Status: DC | PRN
  Administered 2023-09-27: 80 mg via INTRAVENOUS

## 2023-09-27 MED ORDER — LACTATED RINGERS IV SOLN: INTRAVENOUS | Status: DC

## 2023-09-27 MED ORDER — CEFAZOLIN SODIUM-DEXTROSE 2-4 GM/100ML-% IV SOLN
INTRAVENOUS | Status: AC
  Filled 2023-09-27: qty 100

## 2023-09-27 MED ORDER — BUPIVACAINE-EPINEPHRINE (PF) 0.25% -1:200000 IJ SOLN
INTRAMUSCULAR | Status: AC
  Filled 2023-09-27: qty 30

## 2023-09-27 MED ORDER — GLYCOPYRROLATE 0.2 MG/ML IJ SOLN
INTRAMUSCULAR | Status: DC | PRN
  Administered 2023-09-27: .2 mg via INTRAVENOUS

## 2023-09-27 MED ORDER — MIDAZOLAM HCL 2 MG/2ML IJ SOLN
INTRAMUSCULAR | Status: DC | PRN
  Administered 2023-09-27: 2 mg via INTRAVENOUS

## 2023-09-27 MED ORDER — KETOROLAC TROMETHAMINE 30 MG/ML IJ SOLN
INTRAMUSCULAR | Status: DC | PRN
  Administered 2023-09-27: 30 mg via INTRAVENOUS

## 2023-09-27 MED ORDER — DEXMEDETOMIDINE HCL IN NACL 200 MCG/50ML IV SOLN
INTRAVENOUS | Status: DC | PRN
  Administered 2023-09-27: 8 ug via INTRAVENOUS
  Administered 2023-09-27: 4 ug via INTRAVENOUS

## 2023-09-27 MED ORDER — 0.9 % SODIUM CHLORIDE (POUR BTL) OPTIME
TOPICAL | Status: DC | PRN
  Administered 2023-09-27: 1000 mL

## 2023-09-27 MED ORDER — PROPOFOL 10 MG/ML IV BOLUS
INTRAVENOUS | Status: DC | PRN
  Administered 2023-09-27: 150 mg via INTRAVENOUS

## 2023-09-27 MED ORDER — INDOCYANINE GREEN 25 MG IV SOLR
INTRAVENOUS | Status: AC
  Filled 2023-09-27: qty 10

## 2023-09-27 MED ORDER — OXYCODONE HCL 5 MG/5ML PO SOLN: 5.0000 mg | Freq: Once | ORAL | Status: AC | PRN

## 2023-09-27 MED ORDER — FENTANYL CITRATE (PF) 100 MCG/2ML IJ SOLN
INTRAMUSCULAR | Status: DC | PRN
  Administered 2023-09-27 (×2): 50 ug via INTRAVENOUS

## 2023-09-27 MED ORDER — SUGAMMADEX SODIUM 200 MG/2ML IV SOLN
INTRAVENOUS | Status: DC | PRN
  Administered 2023-09-27: 200 mg via INTRAVENOUS

## 2023-09-27 MED ORDER — ROCURONIUM BROMIDE 100 MG/10ML IV SOLN
INTRAVENOUS | Status: DC | PRN
  Administered 2023-09-27 (×3): 10 mg via INTRAVENOUS
  Administered 2023-09-27: 40 mg via INTRAVENOUS

## 2023-09-27 MED ORDER — LIDOCAINE HCL (CARDIAC) PF 100 MG/5ML IV SOSY
PREFILLED_SYRINGE | INTRAVENOUS | Status: DC | PRN
  Administered 2023-09-27: 100 mg via INTRAVENOUS

## 2023-09-27 MED ORDER — CHLORHEXIDINE GLUCONATE 0.12 % MT SOLN
OROMUCOSAL | Status: AC
  Filled 2023-09-27: qty 15

## 2023-09-27 MED ORDER — ACETAMINOPHEN 10 MG/ML IV SOLN
INTRAVENOUS | Status: DC | PRN
  Administered 2023-09-27: 1000 mg via INTRAVENOUS

## 2023-09-27 MED ORDER — FENTANYL CITRATE (PF) 100 MCG/2ML IJ SOLN
INTRAMUSCULAR | Status: AC
Start: 2023-09-27 — End: 2023-09-27
  Filled 2023-09-27: qty 2

## 2023-09-27 MED ORDER — CEFAZOLIN SODIUM-DEXTROSE 2-4 GM/100ML-% IV SOLN
2.0000 g | INTRAVENOUS | Status: AC
  Administered 2023-09-27: 2 g via INTRAVENOUS

## 2023-09-27 MED ORDER — FENTANYL CITRATE (PF) 100 MCG/2ML IJ SOLN
INTRAMUSCULAR | Status: AC
  Filled 2023-09-27: qty 2

## 2023-09-27 MED ORDER — BUPIVACAINE-EPINEPHRINE 0.25% -1:200000 IJ SOLN
INTRAMUSCULAR | Status: DC | PRN
Start: 2023-09-27 — End: 2023-09-27
  Administered 2023-09-27: 30 mL

## 2023-09-27 MED ORDER — PROPOFOL 1000 MG/100ML IV EMUL
INTRAVENOUS | Status: AC
  Filled 2023-09-27: qty 200

## 2023-09-27 MED ORDER — MIDAZOLAM HCL 2 MG/2ML IJ SOLN
INTRAMUSCULAR | Status: AC
  Filled 2023-09-27: qty 2

## 2023-09-27 MED ORDER — ORAL CARE MOUTH RINSE: 15.0000 mL | Freq: Once | OROMUCOSAL | Status: AC

## 2023-09-27 SURGICAL SUPPLY — 43 items
BAG PRESSURE INF REUSE 1000 (BAG) IMPLANT
CANNULA REDUCER 12-8 DVNC XI (CANNULA) ×1 IMPLANT
CATH REDDICK CHOLANGI 4FR 50CM (CATHETERS) IMPLANT
CAUTERY HOOK MNPLR 1.6 DVNC XI (INSTRUMENTS) ×1 IMPLANT
CLIP LIGATING HEM O LOK PURPLE (MISCELLANEOUS) IMPLANT
CLIP LIGATING HEMO O LOK GREEN (MISCELLANEOUS) ×1 IMPLANT
DEFOGGER SCOPE WARM SEASHARP (MISCELLANEOUS) ×1 IMPLANT
DERMABOND ADVANCED .7 DNX12 (GAUZE/BANDAGES/DRESSINGS) ×1 IMPLANT
DRAPE ARM DVNC X/XI (DISPOSABLE) ×4 IMPLANT
DRAPE C-ARM XRAY 36X54 (DRAPES) IMPLANT
DRAPE COLUMN DVNC XI (DISPOSABLE) ×1 IMPLANT
ELECTRODE REM PT RTRN 9FT ADLT (ELECTROSURGICAL) ×1 IMPLANT
FORCEPS BPLR 8 MD DVNC XI (FORCEP) IMPLANT
FORCEPS BPLR FENES DVNC XI (FORCEP) ×1 IMPLANT
FORCEPS PROGRASP DVNC XI (FORCEP) ×1 IMPLANT
GLOVE BIO SURGEON STRL SZ 6.5 (GLOVE) ×2 IMPLANT
GLOVE BIOGEL PI IND STRL 6.5 (GLOVE) ×2 IMPLANT
GLOVE SURG SYN 6.5 PF PI (GLOVE) ×2 IMPLANT
GOWN STRL REUS W/ TWL LRG LVL3 (GOWN DISPOSABLE) ×4 IMPLANT
GRASPER SUT TROCAR 14GX15 (MISCELLANEOUS) ×1 IMPLANT
IRRIGATOR SUCT 8 DISP DVNC XI (IRRIGATION / IRRIGATOR) IMPLANT
IV CATH ANGIO 12GX3 LT BLUE (NEEDLE) IMPLANT
IV NS 1000ML BAXH (IV SOLUTION) IMPLANT
KIT PINK PAD W/HEAD ARM REST (MISCELLANEOUS) ×1 IMPLANT
LABEL OR SOLS (LABEL) ×1 IMPLANT
MANIFOLD NEPTUNE II (INSTRUMENTS) IMPLANT
NDL HYPO 22X1.5 SAFETY MO (MISCELLANEOUS) ×1 IMPLANT
NDL INSUFFLATION 14GA 120MM (NEEDLE) ×1 IMPLANT
NEEDLE HYPO 22X1.5 SAFETY MO (MISCELLANEOUS) ×1 IMPLANT
NEEDLE INSUFFLATION 14GA 120MM (NEEDLE) ×1 IMPLANT
NS IRRIG 500ML POUR BTL (IV SOLUTION) ×1 IMPLANT
OBTURATOR OPTICALSTD 8 DVNC (TROCAR) ×1 IMPLANT
PACK LAP CHOLECYSTECTOMY (MISCELLANEOUS) ×1 IMPLANT
SEAL UNIV 5-12 XI (MISCELLANEOUS) ×4 IMPLANT
SET TUBE SMOKE EVAC HIGH FLOW (TUBING) ×1 IMPLANT
SOLUTION ELECTROSURG ANTI STCK (MISCELLANEOUS) ×1 IMPLANT
SPIKE FLUID TRANSFER (MISCELLANEOUS) ×2 IMPLANT
SPONGE T-LAP 4X18 ~~LOC~~+RFID (SPONGE) IMPLANT
SUT VICRYL 0 UR6 27IN ABS (SUTURE) ×1 IMPLANT
SUTURE MNCRL 4-0 27XMF (SUTURE) ×1 IMPLANT
SYSTEM BAG RETRIEVAL 10MM (BASKET) ×1 IMPLANT
TRAP FLUID SMOKE EVACUATOR (MISCELLANEOUS) IMPLANT
WATER STERILE IRR 500ML POUR (IV SOLUTION) ×1 IMPLANT

## 2023-09-27 NOTE — Op Note (Signed)
 Preoperative diagnosis: Symptomatic cholelithiasis  Postoperative diagnosis: Same  Procedure: Robotic Assisted Laparoscopic Cholecystectomy.   Anesthesia: GETA   Surgeon: Dr. Cesar Coe  Wound Classification: Clean Contaminated  Indications: Patient is a 68 y.o. female developed right upper quadrant pain and on workup was found to have cholelithiasis. Robotic Assisted Laparoscopic cholecystectomy was elected.  Findings: Distended gallbladder with obstructed cystic duct Critical view of safety achieved Cystic duct and artery identified, ligated and divided Adequate hemostasis  Description of procedure: The patient was placed on the operating table in the supine position. General anesthesia was induced. A time-out was completed verifying correct patient, procedure, site, positioning, and implant(s) and/or special equipment prior to beginning this procedure. An orogastric tube was placed. The abdomen was prepped and draped in the usual sterile fashion.  An incision was made in a natural skin line below the umbilicus.  The fascia was elevated and the Veress needle inserted. Proper position was confirmed by aspiration and saline meniscus test.  The abdomen was insufflated with carbon dioxide to a pressure of 15 mmHg. The patient tolerated insufflation well. A 8-mm trocar was then inserted in optiview fashion.  The laparoscope was inserted and the abdomen inspected. No injuries from initial trocar placement were noted. Additional trocars were then inserted in the following locations: an 8-mm trocar in the left lateral abdomen, and another two 8-mm trocars to the right side of the abdomen 5 cm appart. The umbilical trocar was changed to a 12 mm trocar all under direct visualization. The abdomen was inspected and no abnormalities were found. The table was placed in the reverse Trendelenburg position with the right side up. The robotic arms were docked and target anatomy identified. Instrument  inserted under direct visualization.  Filmy adhesions between the gallbladder and omentum, duodenum and transverse colon were lysed with electrocautery.  The gallbladder was very distended and tense.  It was needed to be aspirated to be able to be grabbed.  Then, the dome of the gallbladder was grasped with a prograsp and retracted over the dome of the liver. The infundibulum was also grasped with an atraumatic grasper and retracted toward the right lower quadrant. This maneuver exposed Calot's triangle.  A very difficult and time-consuming dissection of the the peritoneum overlying the gallbladder infundibulum was done due to chronic inflammation and the cystic duct and cystic artery identified and circumferentially dissected. Critical view of safety reviewed before ligating any structure.  A stone was identified in the cystic duct causing obstruction.  I put a clip at the neck of the gallbladder and did a ductotomy of the cystic duct and then I removed the stones that were stuck in the cystic duct able to get bile outflow with ICG green.  Firefly images taken to visualize biliary ducts. The cystic duct and cystic artery were then doubly clipped and divided close to the gallbladder.  The gallbladder was then dissected from its peritoneal attachments by electrocautery. Hemostasis was checked and the gallbladder and contained stones were removed using an endoscopic retrieval bag. The gallbladder was passed off the table as a specimen. The gallbladder fossa was copiously irrigated with saline and hemostasis was obtained. There was no evidence of bleeding from the gallbladder fossa or cystic artery or leakage of the bile from the cystic duct stump. Secondary trocars were removed under direct vision. No bleeding was noted. The robotic arms were undoked. The scope was withdrawn and the umbilical trocar removed. The abdomen was allowed to collapse. The fascia of the 12mm  trocar sites was closed with figure-of-eight 0  vicryl sutures. The skin was closed with subcuticular sutures of 4-0 monocryl and topical skin adhesive. The orogastric tube was removed.  The patient tolerated the procedure well and was taken to the postanesthesia care unit in stable condition.   Specimen: Gallbladder  Complications: None  EBL: 5 mL

## 2023-09-27 NOTE — Anesthesia Procedure Notes (Signed)
 Procedure Name: Intubation Date/Time: 09/27/2023 8:29 AM  Performed by: Ledora Duncan, CRNAPre-anesthesia Checklist: Patient identified, Emergency Drugs available, Suction available and Patient being monitored Patient Re-evaluated:Patient Re-evaluated prior to induction Oxygen Delivery Method: Circle system utilized Preoxygenation: Pre-oxygenation with 100% oxygen Induction Type: IV induction Ventilation: Mask ventilation without difficulty Laryngoscope Size: McGrath and 3 Grade View: Grade I Tube type: Oral Tube size: 6.5 mm Number of attempts: 1 Airway Equipment and Method: Stylet Placement Confirmation: ETT inserted through vocal cords under direct vision, positive ETCO2 and breath sounds checked- equal and bilateral Secured at: 21 cm Tube secured with: Tape Dental Injury: Teeth and Oropharynx as per pre-operative assessment

## 2023-09-27 NOTE — H&P (Signed)
 History of Present Illness Regina Brown is a 68 year old female with choledocholithiasis who presents with abdominal pain and digestive issues.  She has been experiencing abdominal pain, bloating, and digestive issues for the past six months. The pain is described as 'very uncomfortable' and occurs almost daily, typically after eating. She also experiences bloating and gas, and alternates between constipation and diarrhea. She has a lifelong history of constipation, with diarrhea being a more recent development since last fall. No fever or acute cholecystitis symptoms.  An abdominal ultrasound performed on May 26, 2023, revealed cholelithiasis without sonographic evidence of acute cholecystitis. The ultrasound also showed mild hepatic parenchymal echogenicity. The common bile duct measured three millimeters, which is within normal limits. A hepatic function panel conducted on May 17, 2023, showed elevated liver enzymes: AST at 79, ALT at 73, and alkaline phosphatase at 166. Total bilirubin was 0.3 and direct bilirubin was 0.11, both within normal limits. GGT was elevated at 144.  She has identified certain foods, such as ice cream, as triggers for her bloating and crampy pain. She has not found any effective treatments for her symptoms and has not mentioned any current medications.  Family history includes her grandmother having undergone gallbladder surgery. 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   PAST MEDICAL HISTORY:  Past Medical History:  Diagnosis Date  Essential hypertension 04/25/2023  Fibromyalgia 04/25/2023  Hyperlipemia 04/25/2023  Hypothyroidism     PAST SURGICAL HISTORY:  Past Surgical History:  Procedure Laterality Date  ACDF 01/25/2008  [facility]  COLONOSCOPY 2013  Dr. Viktoria, [facility]  HYSTERECTOMY    MEDICATIONS:  Outpatient Encounter Medications as of  07/18/2023  Medication Sig Dispense Refill  amitriptyline  (ELAVIL ) 10 MG tablet 3  estradioL  (ESTRACE ) 2 MG tablet Take 1 tablet by mouth once daily  levothyroxine  (SYNTHROID ) 100 MCG tablet Take 100 mcg by mouth every morning before breakfast (0630)  losartan  (COZAAR ) 50 MG tablet Take 50 mg by mouth once daily  metoprolol  SUCCinate (TOPROL -XL) 50 MG XL tablet Take 50 mg by mouth once daily  multivitamin capsule Take 1 capsule by mouth once daily.  rosuvastatin  (CRESTOR ) 10 MG tablet Take 1 tablet by mouth every evening  sodium, potassium, and magnesium (SUPREP) oral solution Take 1 Bottle by mouth as directed One kit contains 2 bottles. Take both bottles at the times instructed by your provider. 354 mL 0  traMADol  (ULTRAM ) 50 mg tablet 0  venlafaxine  (EFFEXOR -XR) 150 MG XR capsule Take 150 mg by mouth once daily  zolpidem  (AMBIEN ) 10 mg tablet Take 10 mg by mouth nightly as needed for Sleep.  estrogens , esterified,-methyltestosterone  (ESTRATEST  HS) 0.625-1.25 mg tablet 5  fluticasone  (FLONASE ) 50 mcg/actuation nasal spray 3  levothyroxine  (SYNTHROID , LEVOTHROID) 75 MCG tablet Take 75 mcg by mouth once daily. Take on an empty stomach with a glass of water at least 30-60 minutes before breakfast. (Patient not taking: Reported on 07/18/2023)  venlafaxine  (EFFEXOR -XR) 37.5 MG XR capsule Take 37.5 mg by mouth once daily. (Patient not taking: Reported on 04/25/2023)  venlafaxine  (EFFEXOR -XR) 75 MG XR capsule Take 75 mg by mouth once daily (Patient not taking: Reported on 07/18/2023)   No facility-administered encounter medications on file as of 07/18/2023.    ALLERGIES:  Augmentin [amoxicillin-pot clavulanate]  SOCIAL HISTORY:  Social History   Socioeconomic History  Marital status: Married  Tobacco Use  Smoking status: Never  Substance and Sexual Activity  Alcohol use: Yes  Comment: rare   Social Drivers of Corporate investment banker Strain: Low Risk (07/18/2023)  Overall Financial  Resource Strain (CARDIA)  Difficulty of Paying Living Expenses: Not hard at all  Food Insecurity: No Food Insecurity (07/18/2023)  Hunger Vital Sign  Worried About Running Out of Food in the Last Year: Never true  Ran Out of Food in the Last Year: Never true  Transportation Needs: No Transportation Needs (07/18/2023)  PRAPARE - Risk analyst (Medical): No  Lack of Transportation (Non-Medical): No   FAMILY HISTORY:  Family History  Problem Relation Name Age of Onset  High blood pressure (Hypertension) Mother  Hyperlipidemia (Elevated cholesterol) Mother  Heart disease Father    GENERAL REVIEW OF SYSTEMS:   General ROS: negative for - chills, fatigue, fever, weight gain or weight loss Allergy and Immunology ROS: negative for - hives  Hematological and Lymphatic ROS: negative for - bleeding problems or bruising, negative for palpable nodes Endocrine ROS: negative for - heat or cold intolerance, hair changes Respiratory ROS: negative for - cough, shortness of breath or wheezing Cardiovascular ROS: no chest pain or palpitations GI ROS: negative for nausea, vomiting, positive for abdominal pain, diarrhea, constipation Musculoskeletal ROS: negative for - joint swelling or muscle pain Neurological ROS: negative for - confusion, syncope Dermatological ROS: negative for pruritus and rash  PHYSICAL EXAM:  Vitals:  07/18/23 1049  BP: (!) 158/67  Pulse: 78  .  Ht:162.6 cm (5' 4.02) Wt:69.4 kg (153 lb) ADJ:Anib surface area is 1.77 meters squared. Body mass index is 26.25 kg/m.SABRA  GENERAL: Alert, active, oriented x3  HEENT: Pupils equal reactive to light. Extraocular movements are intact. Sclera clear. Palpebral conjunctiva normal red color.Pharynx clear.  NECK: Supple with no palpable mass and no adenopathy.  LUNGS: Sound clear with no rales rhonchi or wheezes.  HEART: Regular rhythm S1 and S2 without murmur.  ABDOMEN: Soft and depressible, nontender with  no palpable mass, no hepatomegaly.   EXTREMITIES: Well-developed well-nourished symmetrical with no dependent edema.  NEUROLOGICAL: Awake alert oriented, facial expression symmetrical, moving all extremities.  Results LABS Hepatic function panel: Total bilirubin 0.3 mg/dL, Direct bilirubin 9.88 mg/dL, Alkaline phosphatase 833 U/L, AST 79 U/L, ALT 73 U/L, GGT 144 U/L (05/17/2023)  RADIOLOGY (I personally evaluated this images) Abdominal ultrasound: Choledocholithiasis without sonographic evidence of acute cholecystitis, mild increase in hepatic parenchymal echogenicity consistent with hepatic steatosis, common bile duct measured 3 mm (05/26/2023)  Assessment & Plan Cholelithiasis She experiences intermittent right upper quadrant abdominal pain, bloating, and gas for six months, likely due to gallstones causing biliary colic. Symptoms worsen with eating. Ultrasound confirms gallstones in the gallbladder without acute cholecystitis. Liver function tests show mild elevation of liver enzymes, but normal bilirubin levels indicate no current obstruction. The common bile duct is normal in size. Gallstones are the probable cause of symptoms, and surgical removal of the gallbladder is recommended to alleviate symptoms and prevent complications like cholecystitis or pancreatitis. Dietary changes or supplements are ineffective in reversing gallstones. Risks of waiting include potential complications, while surgery offers symptom relief and a return to a normal diet post-recovery. Shared decision-making led to scheduling surgery after the upcoming colonoscopy. Annually, 5-30% of symptomatic patients continue to have symptoms, and 1-2% may experience severe complications if untreated. Schedule laparoscopic cholecystectomy after colonoscopy on September 19, 2023. Advise seeking care if pain worsens or becomes more frequent.  Hepatic steatosis  Mild hepatic steatosis is noted on ultrasound, likely related to  dietary  factors. Liver function tests show mild elevation of liver enzymes, but bilirubin levels are normal. Discussed the role of sugar intake and other dietary factors in contributing to fatty liver. Fatty liver is an early sign of liver changes, and no medication can reverse it. Emphasized the importance of monitoring liver function over time to assess progression. Recommend follow-up liver function tests in 3-6 months to monitor liver enzyme levels. Advise on dietary modifications to reduce sugar intake and promote liver health.  Cholelithiasis without cholecystitis [K80.20]   Patient and her husband verbalized understanding, all questions were answered, and were agreeable with the plan outlined above.   Lucas Sjogren, MD

## 2023-09-27 NOTE — Anesthesia Postprocedure Evaluation (Signed)
 Anesthesia Post Note  Patient: Regina Brown  Procedure(s) Performed: CHOLECYSTECTOMY, ROBOT-ASSISTED, LAPAROSCOPIC (Abdomen)  Patient location during evaluation: PACU Anesthesia Type: General Level of consciousness: awake and alert Pain management: pain level controlled Vital Signs Assessment: post-procedure vital signs reviewed and stable Respiratory status: spontaneous breathing, nonlabored ventilation and respiratory function stable Cardiovascular status: blood pressure returned to baseline and stable Postop Assessment: no apparent nausea or vomiting Anesthetic complications: no   No notable events documented.   Last Vitals:  Vitals:   09/27/23 1100 09/27/23 1120  BP: (!) 150/69 (!) 159/71  Pulse: 76 83  Resp: 14   Temp: (!) 36.4 C 36.8 C  SpO2: 93% 97%    Last Pain:  Vitals:   09/27/23 1120  TempSrc:   PainSc: 3                  Fairy POUR Ishmail Mcmanamon

## 2023-09-27 NOTE — Transfer of Care (Signed)
 Immediate Anesthesia Transfer of Care Note  Patient: [name]  Procedure(s) Performed: CHOLECYSTECTOMY, ROBOT-ASSISTED, LAPAROSCOPIC (Abdomen)  Patient Location: PACU  Anesthesia Type:General  Level of Consciousness: drowsy and patient cooperative  Airway & Oxygen Therapy: Patient Spontanous Breathing and Patient connected to face mask oxygen  Post-op Assessment: Report given to RN and Post -op Vital signs reviewed and stable  Post vital signs: Reviewed and stable  Last Vitals:  Vitals Value Taken Time  BP 154/69 09/27/23 10:01  Temp 36.7 C 09/27/23 10:01  Pulse 86 09/27/23 10:02  Resp 20 09/27/23 10:01  SpO2 100 % 09/27/23 10:02  Vitals shown include unfiled device data.  Last Pain:  Vitals:   09/27/23 0725  TempSrc: Temporal  PainSc: 4          Complications: No notable events documented.

## 2023-09-27 NOTE — Anesthesia Preprocedure Evaluation (Signed)
 Anesthesia Evaluation  Patient identified by MRN, date of birth, ID band Patient awake    Reviewed: Allergy & Precautions, NPO status , Patient's Chart, lab work & pertinent test results  History of Anesthesia Complications Negative for: history of anesthetic complications  Airway Mallampati: III  TM Distance: >3 FB Neck ROM: full    Dental  (+) Chipped, Poor Dentition, Missing   Pulmonary neg pulmonary ROS, neg shortness of breath, neg COPD, former smoker   Pulmonary exam normal        Cardiovascular Exercise Tolerance: Good hypertension, Normal cardiovascular exam     Neuro/Psych  Neuromuscular disease  negative psych ROS   GI/Hepatic negative GI ROS, Neg liver ROS,neg GERD  ,,  Endo/Other  Hypothyroidism    Renal/GU      Musculoskeletal   Abdominal   Peds  Hematology negative hematology ROS (+)   Anesthesia Other Findings Past Medical History: No date: Cervicalgia No date: Contusion of foot including toes, left, initial encounter No date: Fibromyalgia No date: Hyperlipidemia No date: Hypertension No date: Hypothyroidism No date: Neck pain No date: Primary osteoarthritis of right knee  Past Surgical History: No date: ABDOMINAL HYSTERECTOMY No date: ACDF; N/A No date: AUGMENTATION MAMMAPLASTY     Comment:  left side only 03/2008: CERVICAL DISC SURGERY     Comment:  Dr Kathi 09/19/2023: COLONOSCOPY; N/A     Comment:  Procedure: COLONOSCOPY;  Surgeon: Toledo, Ladell POUR, MD;              Location: [facility];  Service: Gastroenterology;                Laterality: N/A; No date: LAPAROSCOPIC TOTAL HYSTERECTOMY     Comment:  2010  BMI    Body Mass Index: 26.26 kg/m      Reproductive/Obstetrics negative OB ROS                              Anesthesia Physical Anesthesia Plan  ASA: 2  Anesthesia Plan: General ETT   Post-op Pain Management:    Induction:  Intravenous  PONV Risk Score and Plan: Ondansetron , Dexamethasone , Midazolam  and Treatment may vary due to age or medical condition  Airway Management Planned: Oral ETT  Additional Equipment:   Intra-op Plan:   Post-operative Plan: Extubation in OR  Informed Consent: I have reviewed the patients History and Physical, chart, labs and discussed the procedure including the risks, benefits and alternatives for the proposed anesthesia with the patient or authorized representative who has indicated his/her understanding and acceptance.     Dental Advisory Given  Plan Discussed with: Anesthesiologist, CRNA and Surgeon  Anesthesia Plan Comments: (Patient consented for risks of anesthesia including but not limited to:  - adverse reactions to medications - damage to eyes, teeth, lips or other oral mucosa - nerve damage due to positioning  - sore throat or hoarseness - Damage to heart, brain, nerves, lungs, other parts of body or loss of life  Patient voiced understanding and assent.)        Anesthesia Quick Evaluation

## 2023-09-28 LAB — SURGICAL PATHOLOGY

## 2023-10-02 ENCOUNTER — Ambulatory Visit: Attending: Anesthesiology | Admitting: Anesthesiology

## 2023-10-02 DIAGNOSIS — M961 Postlaminectomy syndrome, not elsewhere classified: Secondary | ICD-10-CM | POA: Diagnosis not present

## 2023-10-02 DIAGNOSIS — M797 Fibromyalgia: Secondary | ICD-10-CM

## 2023-10-02 DIAGNOSIS — M542 Cervicalgia: Secondary | ICD-10-CM | POA: Diagnosis not present

## 2023-10-02 DIAGNOSIS — M1711 Unilateral primary osteoarthritis, right knee: Secondary | ICD-10-CM

## 2023-10-02 DIAGNOSIS — M5412 Radiculopathy, cervical region: Secondary | ICD-10-CM | POA: Diagnosis not present

## 2023-10-04 ENCOUNTER — Other Ambulatory Visit: Payer: Self-pay

## 2023-10-04 ENCOUNTER — Encounter: Payer: Self-pay | Admitting: Anesthesiology

## 2023-10-04 MED ORDER — HYDROCODONE-ACETAMINOPHEN 5-325 MG PO TABS
1.0000 | ORAL_TABLET | ORAL | 0 refills | Status: DC | PRN
  Filled 2023-10-04: qty 16, 3d supply, fill #0

## 2023-10-04 NOTE — Progress Notes (Signed)
 Virtual Visit via Telephone Note  I connected with Regina Brown on 10/04/23 at  1:20 PM EDT by telephone and verified that I am speaking with the correct person using two identifiers.  Location: Patient: Home Provider: Pain control center   I discussed the limitations, risks, security and privacy concerns of performing an evaluation and management service by telephone and the availability of in person appointments. I also discussed with the patient that there may be a patient responsible charge related to this service. The patient expressed understanding and agreed to proceed.   History of Present Illness: I tried several times to reach out to Regina Brown a few days ago with no success.  I was able to get in touch with her this morning for her virtual conference.  She was unable to do the video portion of this.  She reports that she is still recovering from her recent gallbladder surgery.  She still under the weather from that.  In reference to her neck pain and its response to epidural steroid she states that she feels that the left arm referred pain from the neck has improved somewhat perhaps 25 to 30%.  She feels that the surgery has probably worsened that.  She is still having neck pain primarily left side radiating into the left trapezius.  She is also getting a little bit of anterior left shoulder pain she feels is secondary to some of the physical therapy exercises.  Otherwise she is in her usual state of health with no changes in left upper extremity strength nor right side strength.  Review of systems: General: No fevers or chills Pulmonary: No shortness of breath or dyspnea Cardiac: No angina or palpitations or lightheadedness GI: No abdominal pain or constipation Psych: No depression    Observations/Objective:  Current Outpatient Medications:    amitriptyline  (ELAVIL ) 10 MG tablet, Take 1-2 tablets (10-20 mg total) by mouth at bedtime., Disp: 180 tablet, Rfl: 3    azithromycin  (ZITHROMAX ) 250 MG tablet, AZITHROMYCIN  250 MG ORAL TABLET- BEGINNING AM OF PROCEDURE TAKE 2 TABLETS ON DAY 1 THEN TAKE 1 TABLET A DAY FOR 4 DAYS (Patient not taking: Reported on 09/15/2023), Disp: 6 tablet, Rfl: 0   chlorhexidine  (PERIDEX ) 0.12 % solution, Rinse with 15 mLs and expectorate 2 (two) times daily. (Patient not taking: Reported on 09/27/2023), Disp: 473 mL, Rfl: 12   estradiol  (ESTRACE ) 2 MG tablet, Take 1 tablet (2 mg total) by mouth daily., Disp: 90 tablet, Rfl: 4   fluticasone  (FLONASE ) 50 MCG/ACT nasal spray, Place 2 sprays into both nostrils daily. (Patient taking differently: Place 2 sprays into both nostrils daily as needed for allergies.), Disp: 16 g, Rfl: 6   HYDROcodone -acetaminophen  (NORCO/VICODIN) 5-325 MG tablet, Take 1 tablet by mouth every 6 (six) hours. (Patient not taking: Reported on 09/15/2023), Disp: 6 tablet, Rfl: 0   levothyroxine  (SYNTHROID ) 100 MCG tablet, Take 1 tablet (100 mcg total) by mouth daily. (Patient not taking: Reported on 09/04/2023), Disp: 90 tablet, Rfl: 1   levothyroxine  (SYNTHROID ) 100 MCG tablet, Take 1 tablet (100 mcg total) by mouth every morning., Disp: 90 tablet, Rfl: 3   losartan  (COZAAR ) 50 MG tablet, Take 1 tablet (50 mg total) by mouth daily., Disp: 90 tablet, Rfl: 3   metoprolol  succinate (TOPROL -XL) 50 MG 24 hr tablet, Take 1 tablet (50 mg total) by mouth daily., Disp: 90 tablet, Rfl: 3   rosuvastatin  (CRESTOR ) 10 MG tablet, Take 1 tablet (10 mg total) by mouth every evening., Disp: 90  tablet, Rfl: 3   tiZANidine  (ZANAFLEX ) 4 MG tablet, Take 1 tablet (4 mg total) by mouth at bedtime as needed for neck strain., Disp: 90 tablet, Rfl: 1   traMADol  (ULTRAM ) 50 MG tablet, Take 1 tablet (50 mg total) by mouth every 6-8 (six) hours. (Patient not taking: Reported on 09/04/2023), Disp: 100 tablet, Rfl: 3   traMADol  (ULTRAM ) 50 MG tablet, Take 1 tablet (50 mg total) by mouth every 6 (six) to 8 (eight) hours as needed., Disp: 90 tablet, Rfl: 4    venlafaxine  XR (EFFEXOR -XR) 150 MG 24 hr capsule, Take 1 capsule (150 mg total) by mouth daily., Disp: 90 capsule, Rfl: 3   zolpidem  (AMBIEN ) 10 MG tablet, Take 1 tablet (10 mg total) by mouth at bedtime., Disp: 90 tablet, Rfl: 2   Past Medical History:  Diagnosis Date   Cervicalgia    Contusion of foot including toes, left, initial encounter    Fibromyalgia    Hyperlipidemia    Hypertension    Hypothyroidism    Neck pain    Primary osteoarthritis of right knee    Assessment and Plan:  1. Cervicalgia   2. Cervical radiculitis   3. Cervical post-laminectomy syndrome   4. Fibromyalgia   5. Primary osteoarthritis of right knee    Based on her response to epidural steroid injection I feel it is appropriate to try 1 additional epidural steroid to see if we can help further with the radicular symptoms.  I have talked to her in length regarding core stretching exercises and the need to continue with massage and certain activity avoidance to help with the cervical pain.  I do not feel that referral to neurosurgery for further intervention is indicated yet.  I will schedule her for a cervical epidural in about a month once she has had a chance to recover sufficiently from her gallbladder surgery.  Continue upper extremity core stretching exercises as tolerated and continue with primary care follow-up for baseline medical issues. Follow Up Instructions:    I discussed the assessment and treatment plan with the patient. The patient was provided an opportunity to ask questions and all were answered. The patient agreed with the plan and demonstrated an understanding of the instructions.   The patient was advised to call back or seek an in-person evaluation if the symptoms worsen or if the condition fails to improve as anticipated.  I provided 25 minutes of non-face-to-face time during this encounter.   Lynwood KANDICE Clause, MD

## 2023-10-05 ENCOUNTER — Ambulatory Visit: Admitting: Neurosurgery

## 2023-10-05 DIAGNOSIS — M4722 Other spondylosis with radiculopathy, cervical region: Secondary | ICD-10-CM | POA: Diagnosis not present

## 2023-10-05 DIAGNOSIS — M47812 Spondylosis without myelopathy or radiculopathy, cervical region: Secondary | ICD-10-CM

## 2023-10-05 NOTE — Progress Notes (Signed)
 Neurosurgery Telephone (Audio-Only) Note  Requesting Provider     Auston Reyes BIRCH, MD [address] [facility] [place],  [place] [address] T: [phone] F: [phone]  Primary Care Provider Auston Reyes BIRCH, MD [address] [facility] [place] [address] T: [phone] F: [phone]  Telehealth visit was conducted with Vina FORBES Glatter, a 68 y.o. female via telephone.  History of Present Illness: Ms. Mcdevitt is a 68 y.o presenting today via telephone visit to review her response to injections and therapy at [facility].  She underwent a C7-T1 CESI with Dr. Myra on 09/04/23. She states her neck felt better for a couple of weeks and her symptoms gradually started to return. She had a follow up with Dr. Myra who plans to repeat a cervical injection in about a month. She has had 2 visits with PT but has put that on hold due to her recent gallbladder surgery but will start back after being cleared from general surgery.   08/24/23 DALANEY NEEDLE is a 68 y.o. female who presents today to review her recent imaging.  We briefly discussed her MRI results via MyChart but I recommended she return to clinic tor review them in person as well as discuss next steps for plan of care. She requested a referral to Dr. Myra for injection as she has seen him for this in the past and had a good response. I placed a referral on 7/24.  Today she denies any changes to her symptoms but does note that she forgot to mention ringing in her ears and being off balance which started after her last surgery.    LOV 08/03/23 Ms. Syrina Wake is a 68 y.o with a history of HTN, HLD, hypothyroidism who is here today with a chief complaint of left sided neck and shoulder blade pain that has been intermittent of the last 2 years. She notes similar symptoms in 2010 with associated left arm pain and weakness at that time (which are not present now). This resulted in a C6-7 ACDF by Dr. Kathi  which improved her symptoms.    Conservative measures:  Physical therapy:   has not participated in PT Multimodal medical therapy including regular antiinflammatories: Tramadol , Tizanidine , Naproxen   Injections:  no epidural steroid injections   Past Surgery:  03/2008-Ruptured disc surgery with Dr Kathi C6-C7   Vina FORBES Glatter has no symptoms of cervical myelopathy.   The symptoms are causing a significant impact on the patient's life.     General Review of Systems:  A ROS was performed including pertinent positive and negatives as documented.  All other systems are negative.  Prior to Admission medications   Medication Sig Start Date End Date Taking? Authorizing Provider  amitriptyline  (ELAVIL ) 10 MG tablet Take 1-2 tablets (10-20 mg total) by mouth at bedtime. 06/29/23   Corlis Honor BROCKS, MD  azithromycin  (ZITHROMAX ) 250 MG tablet AZITHROMYCIN  250 MG ORAL TABLET- BEGINNING AM OF PROCEDURE TAKE 2 TABLETS ON DAY 1 THEN TAKE 1 TABLET A DAY FOR 4 DAYS Patient not taking: Reported on 09/15/2023 09/05/23     chlorhexidine  (PERIDEX ) 0.12 % solution Rinse with 15 mLs and expectorate 2 (two) times daily. Patient not taking: Reported on 09/27/2023 09/05/23     estradiol  (ESTRACE ) 2 MG tablet Take 1 tablet (2 mg total) by mouth daily. 04/11/23   Corlis Honor BROCKS, MD  fluticasone  (FLONASE ) 50 MCG/ACT nasal spray Place 2 sprays into both nostrils daily. Patient taking differently: Place 2  sprays into both nostrils daily as needed for allergies. 07/22/16   Fisher, Devere ORN, PA-C  HYDROcodone -acetaminophen  (NORCO/VICODIN) 5-325 MG tablet Take 1 tablet by mouth every 6 (six) hours. Patient not taking: Reported on 09/15/2023 09/06/23     HYDROcodone -acetaminophen  (NORCO/VICODIN) 5-325 MG tablet Take 1 tablet by mouth every 4 (four) hours as needed for pain. 10/04/23     levothyroxine  (SYNTHROID ) 100 MCG tablet Take 1 tablet (100 mcg total) by mouth daily. Patient not taking: Reported on 09/04/2023 05/19/23      levothyroxine  (SYNTHROID ) 100 MCG tablet Take 1 tablet (100 mcg total) by mouth every morning. 07/06/23   Corlis Honor BROCKS, MD  losartan  (COZAAR ) 50 MG tablet Take 1 tablet (50 mg total) by mouth daily. 07/06/23   Corlis Honor BROCKS, MD  metoprolol  succinate (TOPROL -XL) 50 MG 24 hr tablet Take 1 tablet (50 mg total) by mouth daily. 06/29/23   Corlis Honor BROCKS, MD  rosuvastatin  (CRESTOR ) 10 MG tablet Take 1 tablet (10 mg total) by mouth every evening. 07/06/23   Corlis Honor BROCKS, MD  tiZANidine  (ZANAFLEX ) 4 MG tablet Take 1 tablet (4 mg total) by mouth at bedtime as needed for neck strain. 07/06/23   Corlis Honor BROCKS, MD  traMADol  (ULTRAM ) 50 MG tablet Take 1 tablet (50 mg total) by mouth every 6-8 (six) hours. Patient not taking: Reported on 09/04/2023 03/08/23     traMADol  (ULTRAM ) 50 MG tablet Take 1 tablet (50 mg total) by mouth every 6 (six) to 8 (eight) hours as needed. 07/06/23   Corlis Honor BROCKS, MD  venlafaxine  XR (EFFEXOR -XR) 150 MG 24 hr capsule Take 1 capsule (150 mg total) by mouth daily. 07/06/23   Corlis Honor BROCKS, MD  zolpidem  (AMBIEN ) 10 MG tablet Take 1 tablet (10 mg total) by mouth at bedtime. 07/06/23     amLODipine  (NORVASC ) 5 MG tablet Take 1 tablet by mouth daily 01/08/21 10/19/21  Corlis Honor BROCKS, MD    DATA REVIEWED    Imaging Studies  MRI C spine 08/09/23 FINDINGS: Alignment: Straightening of the normal cervical lordosis with slight degenerative anterolisthesis at C4-5.   Vertebrae: No fracture or osseous lesion. Status post interbody fusion at C6-7. Moderate chronic degenerative disc disease at C5-6 with discogenic reactive changes within the vertebral endplates. Moderate left-sided facet arthrosis at C4-5 with reactive bone marrow edema.   Cord: Normal in morphology and signal intensity.   Posterior Fossa, vertebral arteries, paraspinal tissues: Normal vertebral artery flow voids. No paraspinous soft tissue lesions or inflammatory change evident.   Disc levels:   Mild degenerative  anterolisthesis at C4-5 with bilateral facet arthrosis, worse on the left. There is mild central spinal canal stenosis and moderate to severe left neural foraminal stenosis. The right neural foramen is patent. There is chronic degenerative disc disease at C5-6 with bilateral uncovertebral joint hypertrophy. There is mild central spinal canal stenosis and moderate right neural foraminal stenosis. The patient is status post interbody fusion at C6-7. The spinal canal and neural foramina are widely patent at the operative level.   IMPRESSION: 1. Degenerative anterolisthesis at C4-5 with bilateral facet arthrosis and edematous changes within the left facet joints. 2. Chronic degenerative disc disease at C5-6 with mild central spinal canal stenosis and moderate right neural foraminal stenosis.     Electronically Signed   By: Evalene Coho M.D.  IMPRESSION  Ms. Burstein is a 68 y.o. female who I performed a telephone encounter today for evaluation and management of cervical spondylosis with radiculopathy  PLAN  Ms. Morea is a very pleasant 68 y.o presenting today via telephone visit to discuss her response to her recent conservative treatment.  It sounds as though she had some relief from her recent cervical injection despite return of her symptoms.  She has a plan with Dr. Myra to have a repeat injection in about a month.  She has paused physical therapy at this time due to her recent cholecystectomy but plans to return to this when she is cleared by therapy.  She would like to continue with conservative management and reserve surgery for if she fails to have significant improvement.  I encouraged her to call our office should her symptoms no longer be managed by conservative treatment and we will get her scheduled to discuss surgical options.  She was encouraged to call our office in the interim should she have any questions or concerns.  She expressed understanding and was in agreement  with this plan.  No orders of the defined types were placed in this encounter.   DISPOSITION  Follow up: In person appointment in [place] if she fails conservative treatment and decides    Edsel Jama Goods, PA   TELEPHONE DOCUMENTATION   This visit was performed via telephone.  Patient location: home Provider location: office  I spent a total of 5 minutes non-face-to-face activities for this visit on the date of this encounter including review of current clinical condition and response to treatment.  The patient is aware of and accepts the limits of this telehealth visit.

## 2023-10-24 ENCOUNTER — Other Ambulatory Visit: Payer: Self-pay

## 2023-11-08 ENCOUNTER — Other Ambulatory Visit: Payer: Self-pay | Admitting: Anesthesiology

## 2023-11-08 ENCOUNTER — Ambulatory Visit
Admission: RE | Admit: 2023-11-08 | Discharge: 2023-11-08 | Disposition: A | Discharge status: Home / Self Care | Source: Ambulatory Visit | Attending: Anesthesiology | Admitting: Anesthesiology

## 2023-11-08 ENCOUNTER — Ambulatory Visit (HOSPITAL_BASED_OUTPATIENT_CLINIC_OR_DEPARTMENT_OTHER): Admitting: Anesthesiology

## 2023-11-08 ENCOUNTER — Encounter: Payer: Self-pay | Admitting: Anesthesiology

## 2023-11-08 VITALS — BP 127/57 | HR 73 | Temp 97.4°F | Resp 16 | Ht 64.0 in | Wt 153.0 lb

## 2023-11-08 DIAGNOSIS — M961 Postlaminectomy syndrome, not elsewhere classified: Secondary | ICD-10-CM | POA: Diagnosis present

## 2023-11-08 DIAGNOSIS — M542 Cervicalgia: Secondary | ICD-10-CM | POA: Diagnosis not present

## 2023-11-08 DIAGNOSIS — M5412 Radiculopathy, cervical region: Secondary | ICD-10-CM | POA: Insufficient documentation

## 2023-11-08 DIAGNOSIS — M1711 Unilateral primary osteoarthritis, right knee: Secondary | ICD-10-CM | POA: Diagnosis present

## 2023-11-08 DIAGNOSIS — M797 Fibromyalgia: Secondary | ICD-10-CM | POA: Insufficient documentation

## 2023-11-08 DIAGNOSIS — G894 Chronic pain syndrome: Secondary | ICD-10-CM

## 2023-11-08 MED ORDER — LIDOCAINE HCL (PF) 2 % IJ SOLN
INTRAMUSCULAR | Status: AC
  Filled 2023-11-08: qty 5

## 2023-11-08 MED ORDER — SODIUM CHLORIDE 0.9% FLUSH
10.0000 mL | Freq: Once | INTRAVENOUS | Status: AC
  Administered 2023-11-08: 10 mL

## 2023-11-08 MED ORDER — MIDAZOLAM HCL 2 MG/2ML IJ SOLN
2.0000 mg | Freq: Once | INTRAMUSCULAR | Status: AC
  Administered 2023-11-08: 2 mg via INTRAVENOUS
  Filled 2023-11-08: qty 2

## 2023-11-08 MED ORDER — LACTATED RINGERS IV SOLN: INTRAVENOUS | Status: DC

## 2023-11-08 MED ORDER — IOHEXOL 180 MG/ML  SOLN
10.0000 mL | Freq: Once | INTRAMUSCULAR | Status: AC | PRN
  Administered 2023-11-08: 10 mL via EPIDURAL
  Filled 2023-11-08: qty 20

## 2023-11-08 MED ORDER — LIDOCAINE HCL (PF) 1 % IJ SOLN
5.0000 mL | Freq: Once | INTRAMUSCULAR | Status: AC
  Administered 2023-11-08: 5 mL via SUBCUTANEOUS

## 2023-11-08 MED ORDER — DEXAMETHASONE SOD PHOSPHATE PF 10 MG/ML IJ SOLN
10.0000 mg | Freq: Once | INTRAMUSCULAR | Status: AC
  Administered 2023-11-08: 10 mg

## 2023-11-08 MED ORDER — ROPIVACAINE HCL 2 MG/ML IJ SOLN
10.0000 mL | Freq: Once | INTRAMUSCULAR | Status: AC
  Administered 2023-11-08: 10 mL via EPIDURAL
  Filled 2023-11-08: qty 20

## 2023-11-08 NOTE — Patient Instructions (Addendum)

## 2023-11-08 NOTE — Progress Notes (Signed)
 Subjective:  Patient ID: Regina Brown, female    DOB: 1955-04-18  Age: 68 y.o. MRN: [id number]  CC: Neck Pain   Procedure: C7-T1 cervical epidural steroid injection under fluoroscopic guidance with minimal sedation  HPI RAMIYAH MCCLENAHAN presents for reevaluation.  She presents today following a previous cervical epidural steroid injection back in August.  She had good relief with that with nearly complete pain relief of her cervical neck pain and left posterior lateral trapezius and shoulder pain.  She has had some recurrence of a similar pain over the course of the last several weeks.  She attributes some of this to more aggressive physical therapy exercises causing this recurrence.  She desires to proceed with a repeat cervical epidural as she feels that she had good response to therapy.  She is going to continue to pursue physical therapy modalities.  She has good range of motion with her neck and is still having some tension in the posterior left neck but no numbness or tingling affecting the hands at this time.  She reports no weakness.  No troubles with bowel or bladder function or balance.  Otherwise she is doing well and trying to do her exercises on a daily basis.  Outpatient Medications Prior to Visit  Medication Sig Dispense Refill   estradiol  (ESTRACE ) 2 MG tablet Take 1 tablet (2 mg total) by mouth daily. 90 tablet 4   fluticasone  (FLONASE ) 50 MCG/ACT nasal spray Place 2 sprays into both nostrils daily. 16 g 6   levothyroxine  (SYNTHROID ) 100 MCG tablet Take 1 tablet (100 mcg total) by mouth every morning. 90 tablet 3   losartan  (COZAAR ) 50 MG tablet Take 1 tablet (50 mg total) by mouth daily. 90 tablet 3   rosuvastatin  (CRESTOR ) 10 MG tablet Take 1 tablet (10 mg total) by mouth every evening. 90 tablet 3   traMADol  (ULTRAM ) 50 MG tablet Take 1 tablet (50 mg total) by mouth every 6 (six) to 8 (eight) hours as needed. 90 tablet 4   venlafaxine  XR (EFFEXOR -XR) 150 MG 24 hr capsule  Take 1 capsule (150 mg total) by mouth daily. 90 capsule 3   zolpidem  (AMBIEN ) 10 MG tablet Take 1 tablet (10 mg total) by mouth at bedtime. 90 tablet 2   amitriptyline  (ELAVIL ) 10 MG tablet Take 1-2 tablets (10-20 mg total) by mouth at bedtime. 180 tablet 3   azithromycin  (ZITHROMAX ) 250 MG tablet AZITHROMYCIN  250 MG ORAL TABLET- BEGINNING AM OF PROCEDURE TAKE 2 TABLETS ON DAY 1 THEN TAKE 1 TABLET A DAY FOR 4 DAYS (Patient not taking: Reported on 09/15/2023) 6 tablet 0   chlorhexidine  (PERIDEX ) 0.12 % solution Rinse with 15 mLs and expectorate 2 (two) times daily. (Patient not taking: Reported on 11/08/2023) 473 mL 12   HYDROcodone -acetaminophen  (NORCO/VICODIN) 5-325 MG tablet Take 1 tablet by mouth every 6 (six) hours. (Patient not taking: Reported on 09/15/2023) 6 tablet 0   HYDROcodone -acetaminophen  (NORCO/VICODIN) 5-325 MG tablet Take 1 tablet by mouth every 4 (four) hours as needed for pain. 16 tablet 0   levothyroxine  (SYNTHROID ) 100 MCG tablet Take 1 tablet (100 mcg total) by mouth daily. (Patient not taking: Reported on 09/04/2023) 90 tablet 1   metoprolol  succinate (TOPROL -XL) 50 MG 24 hr tablet Take 1 tablet (50 mg total) by mouth daily. 90 tablet 3   tiZANidine  (ZANAFLEX ) 4 MG tablet Take 1 tablet (4 mg total) by mouth at bedtime as needed for neck strain. (Patient not taking: Reported on 11/08/2023) 90 tablet  1   traMADol  (ULTRAM ) 50 MG tablet Take 1 tablet (50 mg total) by mouth every 6-8 (six) hours. (Patient not taking: Reported on 09/04/2023) 100 tablet 3   No facility-administered medications prior to visit.    Review of Systems CNS: No confusion or sedation Cardiac: No angina or palpitations GI: No abdominal pain or constipation Constitutional: No nausea vomiting fevers or chills  Objective:  BP (!) 127/57   Pulse 73   Temp (!) 97.4 F (36.3 C) (Temporal)   Resp 16   Ht 5' 4 (1.626 m)   Wt 153 lb (69.4 kg)   SpO2 100%   BMI 26.26 kg/m    BP Readings from Last 3  Encounters:  11/08/23 (!) 127/57  09/27/23 (!) 159/71  09/19/23 122/78     Wt Readings from Last 3 Encounters:  11/08/23 153 lb (69.4 kg)  09/27/23 153 lb (69.4 kg)  09/19/23 152 lb 9.6 oz (69.2 kg)     Physical Exam Pt is alert and oriented PERRL EOMI HEART IS RRR no murmur or rub LCTA no wheezing or rales MUSCULOSKELETAL reveals good range of motion at the atlantooccipital joint with some mild pain on the left and right extension and left and right lateral rotation.  Muscle tone and bulk is good with good range of motion at the glenohumeral joint.  Labs  No results found for: HGBA1C Lab Results  Component Value Date   CREATININE 0.92 09/21/2023    -------------------------------------------------------------------------------------------------------------------- Lab Results  Component Value Date   WBC 7.9 09/21/2023   HGB 13.3 09/21/2023   HCT 39.6 09/21/2023   PLT 254 09/21/2023   GLUCOSE 94 09/21/2023   NA 138 09/21/2023   K 4.0 09/21/2023   CL 107 09/21/2023   CREATININE 0.92 09/21/2023   BUN 17 09/21/2023   CO2 24 09/21/2023    --------------------------------------------------------------------------------------------------------------------- DG PAIN CLINIC C-ARM 1-60 MIN NO REPORT Result Date: 11/08/2023 Fluoro was used, but no Radiologist interpretation will be provided. Please refer to NOTES tab for provider progress note.    Assessment & Plan:   Zeynep was seen today for neck pain.  Diagnoses and all orders for this visit:  Cervicalgia  Cervical radiculitis -     sodium chloride  flush (NS) 0.9 % injection 10 mL -     ropivacaine  (PF) 2 mg/mL (0.2%) (NAROPIN ) injection 10 mL -     midazolam  (VERSED ) injection 2 mg -     lidocaine  (PF) (XYLOCAINE ) 1 % injection 5 mL -     lactated ringers  infusion -     iohexol  (OMNIPAQUE ) 180 MG/ML injection 10 mL -     dexamethasone  (DECADRON ) injection 10 mg  Cervical post-laminectomy syndrome -      sodium chloride  flush (NS) 0.9 % injection 10 mL -     ropivacaine  (PF) 2 mg/mL (0.2%) (NAROPIN ) injection 10 mL -     midazolam  (VERSED ) injection 2 mg -     lidocaine  (PF) (XYLOCAINE ) 1 % injection 5 mL -     lactated ringers  infusion -     iohexol  (OMNIPAQUE ) 180 MG/ML injection 10 mL -     dexamethasone  (DECADRON ) injection 10 mg  Fibromyalgia  Primary osteoarthritis of right knee        ----------------------------------------------------------------------------------------------------------------------  Problem List Items Addressed This Visit       Unprioritized   Cervical post-laminectomy syndrome   Relevant Medications   lactated ringers  infusion   Cervical radiculitis   Relevant Medications   lactated ringers  infusion  Cervicalgia - Primary   Fibromyalgia   Other Visit Diagnoses       Primary osteoarthritis of right knee       Relevant Medications   dexamethasone  (DECADRON ) injection 10 mg (Completed)         ----------------------------------------------------------------------------------------------------------------------  1. Cervicalgia (Primary) Will proceed with a repeat cervical epidural today.  She has responded favorably to this at her past August injection.  We gone over the risks and benefits of the procedure with her in full detail all questions answered.  I encouraged her to continue with cervical stretching strengthening exercises as reviewed once again with her today.  Continue with physical therapy exercises TENS unit application hot and cold as needed.  We have also talked about avoidance of certain activities such as raking and excessive vacuuming.  Will have her return to clinic in 1 month for reevaluation.  2. Cervical radiculitis As above with cervical epidural today - sodium chloride  flush (NS) 0.9 % injection 10 mL - ropivacaine  (PF) 2 mg/mL (0.2%) (NAROPIN ) injection 10 mL - midazolam  (VERSED ) injection 2 mg - lidocaine  (PF)  (XYLOCAINE ) 1 % injection 5 mL - lactated ringers  infusion - iohexol  (OMNIPAQUE ) 180 MG/ML injection 10 mL - dexamethasone  (DECADRON ) injection 10 mg  3. Cervical post-laminectomy syndrome As above - sodium chloride  flush (NS) 0.9 % injection 10 mL - ropivacaine  (PF) 2 mg/mL (0.2%) (NAROPIN ) injection 10 mL - midazolam  (VERSED ) injection 2 mg - lidocaine  (PF) (XYLOCAINE ) 1 % injection 5 mL - lactated ringers  infusion - iohexol  (OMNIPAQUE ) 180 MG/ML injection 10 mL - dexamethasone  (DECADRON ) injection 10 mg  4. Fibromyalgia Continue with physical therapy  5. Primary osteoarthritis of right knee     ----------------------------------------------------------------------------------------------------------------------  I am having Vina CHARLENA Glatter maintain her fluticasone , traMADol , estradiol , levothyroxine , metoprolol  succinate, amitriptyline , zolpidem , traMADol , levothyroxine , losartan , rosuvastatin , tiZANidine , venlafaxine  XR, azithromycin , chlorhexidine , HYDROcodone -acetaminophen , and HYDROcodone -acetaminophen . We administered sodium chloride  flush, ropivacaine  (PF) 2 mg/mL (0.2%), midazolam , lidocaine  (PF), lactated ringers , iohexol , and dexamethasone .   Meds ordered this encounter  Medications   sodium chloride  flush (NS) 0.9 % injection 10 mL   ropivacaine  (PF) 2 mg/mL (0.2%) (NAROPIN ) injection 10 mL   midazolam  (VERSED ) injection 2 mg   lidocaine  (PF) (XYLOCAINE ) 1 % injection 5 mL   lactated ringers  infusion   iohexol  (OMNIPAQUE ) 180 MG/ML injection 10 mL   dexamethasone  (DECADRON ) injection 10 mg   Patient's Medications  New Prescriptions   No medications on file  Previous Medications   AMITRIPTYLINE  (ELAVIL ) 10 MG TABLET    Take 1-2 tablets (10-20 mg total) by mouth at bedtime.   AZITHROMYCIN  (ZITHROMAX ) 250 MG TABLET    AZITHROMYCIN  250 MG ORAL TABLET- BEGINNING AM OF PROCEDURE TAKE 2 TABLETS ON DAY 1 THEN TAKE 1 TABLET A DAY FOR 4 DAYS   CHLORHEXIDINE  (PERIDEX )  0.12 % SOLUTION    Rinse with 15 mLs and expectorate 2 (two) times daily.   ESTRADIOL  (ESTRACE ) 2 MG TABLET    Take 1 tablet (2 mg total) by mouth daily.   FLUTICASONE  (FLONASE ) 50 MCG/ACT NASAL SPRAY    Place 2 sprays into both nostrils daily.   HYDROCODONE -ACETAMINOPHEN  (NORCO/VICODIN) 5-325 MG TABLET    Take 1 tablet by mouth every 6 (six) hours.   HYDROCODONE -ACETAMINOPHEN  (NORCO/VICODIN) 5-325 MG TABLET    Take 1 tablet by mouth every 4 (four) hours as needed for pain.   LEVOTHYROXINE  (SYNTHROID ) 100 MCG TABLET    Take 1 tablet (100 mcg total) by mouth daily.   LEVOTHYROXINE  (  SYNTHROID ) 100 MCG TABLET    Take 1 tablet (100 mcg total) by mouth every morning.   LOSARTAN  (COZAAR ) 50 MG TABLET    Take 1 tablet (50 mg total) by mouth daily.   METOPROLOL  SUCCINATE (TOPROL -XL) 50 MG 24 HR TABLET    Take 1 tablet (50 mg total) by mouth daily.   ROSUVASTATIN  (CRESTOR ) 10 MG TABLET    Take 1 tablet (10 mg total) by mouth every evening.   TIZANIDINE  (ZANAFLEX ) 4 MG TABLET    Take 1 tablet (4 mg total) by mouth at bedtime as needed for neck strain.   TRAMADOL  (ULTRAM ) 50 MG TABLET    Take 1 tablet (50 mg total) by mouth every 6-8 (six) hours.   TRAMADOL  (ULTRAM ) 50 MG TABLET    Take 1 tablet (50 mg total) by mouth every 6 (six) to 8 (eight) hours as needed.   VENLAFAXINE  XR (EFFEXOR -XR) 150 MG 24 HR CAPSULE    Take 1 capsule (150 mg total) by mouth daily.   ZOLPIDEM  (AMBIEN ) 10 MG TABLET    Take 1 tablet (10 mg total) by mouth at bedtime.  Modified Medications   No medications on file  Discontinued Medications   No medications on file   ----------------------------------------------------------------------------------------------------------------------  Follow-up: Return in about 1 month (around 12/09/2023), or if symptoms worsen or fail to improve, for evaluation, med refill.  patient was taken to the fluoroscopy suite and placed in the prone position. IV sedation was with Versed  yielding minimal  sedation for the procedure. The patient was responsive throughout and vital signs were stable throughout. The area overlying the cervical region was prepped with stereo prep 3 and then 1% lidocaine  2 cc was injected with a 25-gauge needle at the C7-T1 interspace. This was done with strict aseptic technique. I then advanced an 18-gauge Touhy needle using a hanging drop technique and AP fluoroscopic guidance. There was loss of resistance at approximately 5 cm no paresthesia and clear evidence of epidural placement confirmed with 2 cc of contrast dye yielding good epidural spread. Following this I injected a mixture comprised of 10 mg of Decadron  1 cc of normal saline and 1 cc of ropivacaine  0.2%. The needle was withdrawn and the patient tolerated the procedure without difficulty and was convalesced and discharged home for follow-up as mentioned.  Lynwood KANDICE Clause, MD

## 2023-11-09 ENCOUNTER — Telehealth: Payer: Self-pay | Admitting: *Deleted

## 2023-11-09 ENCOUNTER — Other Ambulatory Visit: Payer: Self-pay

## 2023-11-09 NOTE — Telephone Encounter (Signed)
 No problems post procedure.

## 2023-11-13 ENCOUNTER — Other Ambulatory Visit: Payer: Self-pay

## 2023-11-14 ENCOUNTER — Other Ambulatory Visit: Payer: Self-pay

## 2023-11-15 ENCOUNTER — Other Ambulatory Visit: Payer: Self-pay

## 2023-12-06 ENCOUNTER — Ambulatory Visit: Attending: Anesthesiology | Admitting: Anesthesiology

## 2023-12-06 ENCOUNTER — Encounter: Payer: Self-pay | Admitting: Anesthesiology

## 2023-12-06 ENCOUNTER — Other Ambulatory Visit: Payer: Self-pay

## 2023-12-06 VITALS — BP 168/90 | HR 82 | Temp 97.0°F | Resp 16 | Ht 64.0 in | Wt 153.0 lb

## 2023-12-06 DIAGNOSIS — M961 Postlaminectomy syndrome, not elsewhere classified: Secondary | ICD-10-CM | POA: Insufficient documentation

## 2023-12-06 DIAGNOSIS — M797 Fibromyalgia: Secondary | ICD-10-CM | POA: Diagnosis present

## 2023-12-06 DIAGNOSIS — M542 Cervicalgia: Secondary | ICD-10-CM | POA: Diagnosis present

## 2023-12-06 DIAGNOSIS — M1711 Unilateral primary osteoarthritis, right knee: Secondary | ICD-10-CM | POA: Insufficient documentation

## 2023-12-06 DIAGNOSIS — M5412 Radiculopathy, cervical region: Secondary | ICD-10-CM | POA: Insufficient documentation

## 2023-12-06 MED ORDER — CELECOXIB 100 MG PO CAPS
100.0000 mg | ORAL_CAPSULE | Freq: Two times a day (BID) | ORAL | 2 refills | Status: AC
  Filled 2023-12-06: qty 60, 30d supply, fill #0
  Filled 2024-02-01: qty 60, 30d supply, fill #1
  Filled 2024-02-27: qty 60, 30d supply, fill #2

## 2023-12-06 NOTE — Progress Notes (Signed)
 Safety precautions to be maintained throughout the outpatient stay will include: orient to surroundings, keep bed in low position, maintain call bell within reach at all times, provide assistance with transfer out of bed and ambulation.

## 2023-12-14 ENCOUNTER — Other Ambulatory Visit: Payer: Self-pay | Admitting: Internal Medicine

## 2023-12-14 DIAGNOSIS — Z1231 Encounter for screening mammogram for malignant neoplasm of breast: Secondary | ICD-10-CM

## 2023-12-18 NOTE — Progress Notes (Signed)
 Subjective:  Patient ID: Regina Brown, female    DOB: 10/29/1955  Age: 68 y.o. MRN: [id number]  CC: Neck Pain (Left )   Procedure: None  HPI [name] presents for a repeat evaluation.  Regina Brown continues to have intermittent difficulties with ongoing cervical neck pain.  The quality characteristic and distribution of these has been essentially stable.  She notes that with certain activity around the house especially with raking or vacuuming she does get exacerbation of her typical neck pain.  This occasionally radiates into the upper trapezius muscles and posterior 6 splenius capitis muscle.  Rarely does she experience any significant radicular pain into the hand or wrist that this can sometimes occur.  Her strength has been generally well-preserved.  She takes her medication sparingly and generally is relying on Celebrex  to help with pain control.  Otherwise she is in her usual state of health.  She tries to stay active and do her stretching strengthening exercises as tolerated.  Outpatient Medications Prior to Visit  Medication Sig Dispense Refill   amitriptyline  (ELAVIL ) 10 MG tablet Take 1-2 tablets (10-20 mg total) by mouth at bedtime. 180 tablet 3   azithromycin  (ZITHROMAX ) 250 MG tablet AZITHROMYCIN  250 MG ORAL TABLET- BEGINNING AM OF PROCEDURE TAKE 2 TABLETS ON DAY 1 THEN TAKE 1 TABLET A DAY FOR 4 DAYS (Patient not taking: Reported on 09/15/2023) 6 tablet 0   chlorhexidine  (PERIDEX ) 0.12 % solution Rinse with 15 mLs and expectorate 2 (two) times daily. (Patient not taking: Reported on 11/08/2023) 473 mL 12   estradiol  (ESTRACE ) 2 MG tablet Take 1 tablet (2 mg total) by mouth daily. 90 tablet 4   fluticasone  (FLONASE ) 50 MCG/ACT nasal spray Place 2 sprays into both nostrils daily. 16 g 6   HYDROcodone -acetaminophen  (NORCO/VICODIN) 5-325 MG tablet Take 1 tablet by mouth every 6 (six) hours. (Patient not taking: Reported on 09/15/2023) 6 tablet 0   levothyroxine  (SYNTHROID ) 100 MCG  tablet Take 1 tablet (100 mcg total) by mouth daily. (Patient not taking: Reported on 09/04/2023) 90 tablet 1   levothyroxine  (SYNTHROID ) 100 MCG tablet Take 1 tablet (100 mcg total) by mouth every morning. 90 tablet 3   losartan  (COZAAR ) 50 MG tablet Take 1 tablet (50 mg total) by mouth daily. 90 tablet 3   metoprolol  succinate (TOPROL -XL) 50 MG 24 hr tablet Take 1 tablet (50 mg total) by mouth daily. 90 tablet 3   rosuvastatin  (CRESTOR ) 10 MG tablet Take 1 tablet (10 mg total) by mouth every evening. 90 tablet 3   tiZANidine  (ZANAFLEX ) 4 MG tablet Take 1 tablet (4 mg total) by mouth at bedtime as needed for neck strain. (Patient not taking: Reported on 11/08/2023) 90 tablet 1   traMADol  (ULTRAM ) 50 MG tablet Take 1 tablet (50 mg total) by mouth every 6-8 (six) hours. (Patient not taking: Reported on 09/04/2023) 100 tablet 3   traMADol  (ULTRAM ) 50 MG tablet Take 1 tablet (50 mg total) by mouth every 6 (six) to 8 (eight) hours as needed. 90 tablet 4   venlafaxine  XR (EFFEXOR -XR) 150 MG 24 hr capsule Take 1 capsule (150 mg total) by mouth daily. 90 capsule 3   zolpidem  (AMBIEN ) 10 MG tablet Take 1 tablet (10 mg total) by mouth at bedtime. 90 tablet 2   HYDROcodone -acetaminophen  (NORCO/VICODIN) 5-325 MG tablet Take 1 tablet by mouth every 4 (four) hours as needed for pain. 16 tablet 0   No facility-administered medications prior to visit.    Review of  Systems CNS: No confusion or sedation Cardiac: No angina or palpitations GI: No abdominal pain or constipation Constitutional: No nausea vomiting fevers or chills  Objective:  BP (!) 168/90 (BP Location: Right Arm, Patient Position: Sitting, Cuff Size: Normal)   Pulse 82   Temp (!) 97 F (36.1 C) (Temporal)   Resp 16   Ht 5' 4 (1.626 m)   Wt 153 lb (69.4 kg)   SpO2 96%   BMI 26.26 kg/m    BP Readings from Last 3 Encounters:  12/06/23 (!) 168/90  11/08/23 (!) 127/57  09/27/23 (!) 159/71     Wt Readings from Last 3 Encounters:   12/06/23 153 lb (69.4 kg)  11/08/23 153 lb (69.4 kg)  09/27/23 153 lb (69.4 kg)     Physical Exam Pt is alert and oriented PERRL EOMI HEART IS RRR no murmur or rub LCTA no wheezing or rales MUSCULOSKELETAL reveals good grip strength good strength and strength to the bilateral bicep and tricep region.  She does have some tenderness in the bilateral trapezius musculature.  She also has some tenderness in the teres major and minor region as well as the rhomboids.  Good range of motion to the atlantooccipital joint.  Labs  No results found for: HGBA1C Lab Results  Component Value Date   CREATININE 0.92 09/21/2023    -------------------------------------------------------------------------------------------------------------------- Lab Results  Component Value Date   WBC 7.9 09/21/2023   HGB 13.3 09/21/2023   HCT 39.6 09/21/2023   PLT 254 09/21/2023   GLUCOSE 94 09/21/2023   NA 138 09/21/2023   K 4.0 09/21/2023   CL 107 09/21/2023   CREATININE 0.92 09/21/2023   BUN 17 09/21/2023   CO2 24 09/21/2023    --------------------------------------------------------------------------------------------------------------------- DG PAIN CLINIC C-ARM 1-60 MIN NO REPORT Result Date: 11/08/2023 Fluoro was used, but no Radiologist interpretation will be provided. Please refer to NOTES tab for provider progress note.    Assessment & Plan:   Hadlie was seen today for neck pain.  Diagnoses and all orders for this visit:  Cervicalgia  Cervical radiculitis  Cervical post-laminectomy syndrome  Fibromyalgia  Primary osteoarthritis of right knee  Other orders -     celecoxib  (CELEBREX ) 100 MG capsule; Take 1 capsule (100 mg total) by mouth 2 (two) times daily.        ----------------------------------------------------------------------------------------------------------------------  Problem List Items Addressed This Visit       Unprioritized   Cervical  post-laminectomy syndrome   Cervical radiculitis   Cervicalgia - Primary   Fibromyalgia   Relevant Medications   celecoxib  (CELEBREX ) 100 MG capsule   Other Visit Diagnoses       Primary osteoarthritis of right knee       Relevant Medications   celecoxib  (CELEBREX ) 100 MG capsule         ----------------------------------------------------------------------------------------------------------------------  1. Cervicalgia (Primary) Continue with core stretching strengthening exercises.  I think is reasonable to continue with the Celebrex  as this is working well as conservative therapy.  She can certainly go up to 100 mg twice a day dosing as needed.  Continue with massage TENS unit application heat and ice with avoidance of certain activities as discussed today.  2. Cervical radiculitis As above  3. Cervical post-laminectomy syndrome As above  4. Fibromyalgia Continue upper and lower extremity core stretching exercises  5. Primary osteoarthritis of right knee     ----------------------------------------------------------------------------------------------------------------------  I am having Vina [facility]RA[facility]. [facility] start on celecoxib . I am also having her maintain her fluticasone , traMADol , estradiol ,  levothyroxine , metoprolol  succinate, amitriptyline , zolpidem , traMADol , levothyroxine , losartan , rosuvastatin , tiZANidine , venlafaxine  XR, azithromycin , chlorhexidine , and HYDROcodone -acetaminophen .   Meds ordered this encounter  Medications   celecoxib  (CELEBREX ) 100 MG capsule    Sig: Take 1 capsule (100 mg total) by mouth 2 (two) times daily.    Dispense:  60 capsule    Refill:  2   Patient's Medications  New Prescriptions   CELECOXIB  (CELEBREX ) 100 MG CAPSULE    Take 1 capsule (100 mg total) by mouth 2 (two) times daily.  Previous Medications   AMITRIPTYLINE  (ELAVIL ) 10 MG TABLET    Take 1-2 tablets (10-20 mg total) by mouth at bedtime.   AZITHROMYCIN  (ZITHROMAX ) 250 MG  TABLET    AZITHROMYCIN  250 MG ORAL TABLET- BEGINNING AM OF PROCEDURE TAKE 2 TABLETS ON DAY 1 THEN TAKE 1 TABLET A DAY FOR 4 DAYS   CHLORHEXIDINE  (PERIDEX ) 0.12 % SOLUTION    Rinse with 15 mLs and expectorate 2 (two) times daily.   ESTRADIOL  (ESTRACE ) 2 MG TABLET    Take 1 tablet (2 mg total) by mouth daily.   FLUTICASONE  (FLONASE ) 50 MCG/ACT NASAL SPRAY    Place 2 sprays into both nostrils daily.   HYDROCODONE -ACETAMINOPHEN  (NORCO/VICODIN) 5-325 MG TABLET    Take 1 tablet by mouth every 6 (six) hours.   LEVOTHYROXINE  (SYNTHROID ) 100 MCG TABLET    Take 1 tablet (100 mcg total) by mouth daily.   LEVOTHYROXINE  (SYNTHROID ) 100 MCG TABLET    Take 1 tablet (100 mcg total) by mouth every morning.   LOSARTAN  (COZAAR ) 50 MG TABLET    Take 1 tablet (50 mg total) by mouth daily.   METOPROLOL  SUCCINATE (TOPROL -XL) 50 MG 24 HR TABLET    Take 1 tablet (50 mg total) by mouth daily.   ROSUVASTATIN  (CRESTOR ) 10 MG TABLET    Take 1 tablet (10 mg total) by mouth every evening.   TIZANIDINE  (ZANAFLEX ) 4 MG TABLET    Take 1 tablet (4 mg total) by mouth at bedtime as needed for neck strain.   TRAMADOL  (ULTRAM ) 50 MG TABLET    Take 1 tablet (50 mg total) by mouth every 6-8 (six) hours.   TRAMADOL  (ULTRAM ) 50 MG TABLET    Take 1 tablet (50 mg total) by mouth every 6 (six) to 8 (eight) hours as needed.   VENLAFAXINE  XR (EFFEXOR -XR) 150 MG 24 HR CAPSULE    Take 1 capsule (150 mg total) by mouth daily.   ZOLPIDEM  (AMBIEN ) 10 MG TABLET    Take 1 tablet (10 mg total) by mouth at bedtime.  Modified Medications   No medications on file  Discontinued Medications   HYDROCODONE -ACETAMINOPHEN  (NORCO/VICODIN) 5-325 MG TABLET    Take 1 tablet by mouth every 4 (four) hours as needed for pain.   ----------------------------------------------------------------------------------------------------------------------  Follow-up: Return in about 2 months (around 02/05/2024) for evaluation, med refill.    Lynwood KANDICE Clause, MD

## 2024-01-08 ENCOUNTER — Other Ambulatory Visit: Payer: Self-pay

## 2024-01-08 MED ORDER — HYDROCODONE-ACETAMINOPHEN 5-325 MG PO TABS
1.0000 | ORAL_TABLET | Freq: Four times a day (QID) | ORAL | 0 refills | Status: AC
  Filled 2024-01-08: qty 6, 2d supply, fill #0

## 2024-01-08 MED ORDER — AZITHROMYCIN 250 MG PO TABS
ORAL_TABLET | ORAL | 0 refills | Status: AC
  Filled 2024-01-08: qty 6, 5d supply, fill #0

## 2024-01-08 MED ORDER — CHLORHEXIDINE GLUCONATE 0.12 % MT SOLN
15.0000 mL | Freq: Two times a day (BID) | OROMUCOSAL | 12 refills | Status: AC
  Filled 2024-01-08 – 2024-02-27 (×2): qty 473, 16d supply, fill #0

## 2024-01-09 ENCOUNTER — Other Ambulatory Visit: Payer: Self-pay

## 2024-01-09 MED ORDER — TRAMADOL HCL 50 MG PO TABS
100.0000 mg | ORAL_TABLET | Freq: Four times a day (QID) | ORAL | 3 refills | Status: AC | PRN
  Filled 2024-01-09: qty 100, 13d supply, fill #0
  Filled 2024-02-01: qty 100, 13d supply, fill #1
  Filled 2024-02-27: qty 100, 13d supply, fill #2

## 2024-01-10 ENCOUNTER — Other Ambulatory Visit: Payer: Self-pay

## 2024-01-10 MED ORDER — ZOLPIDEM TARTRATE 10 MG PO TABS
10.0000 mg | ORAL_TABLET | Freq: Every day | ORAL | 2 refills | Status: AC
  Filled 2024-02-01: qty 90, 90d supply, fill #0
  Filled 2024-02-11: qty 90, 90d supply, fill #1

## 2024-02-02 ENCOUNTER — Other Ambulatory Visit: Payer: Self-pay

## 2024-02-08 ENCOUNTER — Ambulatory Visit
Admission: RE | Admit: 2024-02-08 | Discharge: 2024-02-08 | Disposition: A | Discharge status: Home / Self Care | Source: Ambulatory Visit | Attending: Internal Medicine | Admitting: Internal Medicine

## 2024-02-08 DIAGNOSIS — Z1231 Encounter for screening mammogram for malignant neoplasm of breast: Secondary | ICD-10-CM | POA: Insufficient documentation

## 2024-02-11 ENCOUNTER — Other Ambulatory Visit: Payer: Self-pay

## 2024-02-12 ENCOUNTER — Other Ambulatory Visit: Payer: Self-pay

## 2024-02-15 ENCOUNTER — Other Ambulatory Visit: Payer: Self-pay

## 2024-02-24 ENCOUNTER — Other Ambulatory Visit: Payer: Self-pay

## 2024-02-26 ENCOUNTER — Other Ambulatory Visit: Payer: Self-pay

## 2024-02-27 ENCOUNTER — Other Ambulatory Visit: Payer: Self-pay

## 2024-02-28 ENCOUNTER — Other Ambulatory Visit: Payer: Self-pay

## 2024-02-29 ENCOUNTER — Other Ambulatory Visit: Payer: Self-pay
